# Patient Record
Sex: Female | Born: 1955
Health system: Southern US, Community
[De-identification: ages and names within clinical notes are randomized; demographics above are authoritative.]

## PROBLEM LIST (undated history)

## (undated) DIAGNOSIS — T7840XA Allergy, unspecified, initial encounter: Secondary | ICD-10-CM

## (undated) DIAGNOSIS — M069 Rheumatoid arthritis, unspecified: Secondary | ICD-10-CM

## (undated) HISTORY — DX: Rheumatoid arthritis, unspecified: M06.9

## (undated) HISTORY — PX: SYNOVECTOMY FOOT: SUR1321

## (undated) HISTORY — DX: Allergy, unspecified, initial encounter: T78.40XA

---

## 1977-06-03 HISTORY — PX: INNER EAR SURGERY: SHX679

## 1995-06-04 HISTORY — PX: ANKLE FUSION: SHX881

## 2008-05-13 ENCOUNTER — Other Ambulatory Visit: Admission: RE | Admit: 2008-05-13 | Discharge: 2008-05-13 | Payer: Self-pay | Admitting: Family Medicine

## 2009-12-25 ENCOUNTER — Other Ambulatory Visit: Admission: RE | Admit: 2009-12-25 | Discharge: 2009-12-25 | Payer: Self-pay | Admitting: Family Medicine

## 2011-11-13 ENCOUNTER — Other Ambulatory Visit: Payer: Self-pay

## 2011-11-13 DIAGNOSIS — I83893 Varicose veins of bilateral lower extremities with other complications: Secondary | ICD-10-CM

## 2011-12-13 ENCOUNTER — Encounter: Payer: Self-pay | Admitting: Vascular Surgery

## 2011-12-31 ENCOUNTER — Encounter: Payer: Self-pay | Admitting: Vascular Surgery

## 2012-01-01 ENCOUNTER — Encounter (INDEPENDENT_AMBULATORY_CARE_PROVIDER_SITE_OTHER): Payer: BC Managed Care – PPO | Admitting: *Deleted

## 2012-01-01 ENCOUNTER — Encounter: Payer: Self-pay | Admitting: Vascular Surgery

## 2012-01-01 ENCOUNTER — Ambulatory Visit (INDEPENDENT_AMBULATORY_CARE_PROVIDER_SITE_OTHER): Payer: BC Managed Care – PPO | Admitting: Vascular Surgery

## 2012-01-01 VITALS — BP 147/74 | HR 62 | Resp 18 | Ht 65.0 in | Wt 121.0 lb

## 2012-01-01 DIAGNOSIS — M79609 Pain in unspecified limb: Secondary | ICD-10-CM | POA: Insufficient documentation

## 2012-01-01 DIAGNOSIS — I83893 Varicose veins of bilateral lower extremities with other complications: Secondary | ICD-10-CM | POA: Insufficient documentation

## 2012-01-01 NOTE — Progress Notes (Signed)
[facility] and [facility]in [facility]ist [facility]  Patient name: Brittany Gutierrez MRN: [id number] DOB: [birth year] Sex: female  REASON FOR CONSULT: varicose veins of the right lower extremity  HPI: Brittany Gutierrez is a 56 y.o. female who is had varicose veins her right lower extremity for many years. These have been gradually worsening. She experiences aching pain and heaviness in her legs when she is standing or sitting for a prolonged period of time. 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 She is unaware of any history of DVT or phlebitis. She's had no previous bleeding episodes related to her varicose veins.  Past Medical History  Diagnosis Date  . RA (rheumatoid arthritis)     juvenile  . Allergy     seasonal allergies    Family History  Problem Relation Age of Onset  . Hyperlipidemia Mother   . Hypertension Mother   . Cancer Father     postate  . Hypertension Maternal Grandmother     SOCIAL HISTORY: History  Substance Use Topics  . Smoking status: Former Smoker -- 1.0 packs/day    Types: Cigarettes    Quit date: 06/03/1980  . Smokeless tobacco: Not on file  . Alcohol Use: No    Allergies  Allergen Reactions  . Penicillins Rash    Current Outpatient Prescriptions  Medication Sig Dispense Refill  . Calcium Carbonate-Vit D-Min 600-200 MG-UNIT TABS Take by mouth daily.      . Multiple Vitamin (MULTIVITAMIN) tablet Take 1 tablet by mouth daily.      . naproxen (NAPROSYN) 500 MG tablet Take 500 mg by mouth as needed.      . vitamin C (ASCORBIC ACID) 250 MG tablet Take 250 mg by mouth daily.        REVIEW OF SYSTEMS: Arly.Keller ] denotes positive finding; [  ] denotes negative finding  CARDIOVASCULAR:  [ ]  chest pain   [ ]  chest pressure   [ ]  palpitations   [ ]  orthopnea   [ ]  dyspnea on exertion   [ ]  claudication   [ ]  rest pain   [ ]  DVT   [ ]  phlebitis PULMONARY:   [ ]  productive cough   [ ]  asthma   [ ]  wheezing NEUROLOGIC:   [ ]  weakness  [ ]   paresthesias  [ ]  aphasia  [ ]  amaurosis  [ ]  dizziness HEMATOLOGIC:   [ ]  bleeding problems   [ ]  clotting disorders MUSCULOSKELETAL:  [ ]  joint pain   [ ]  joint swelling [ ]  leg swelling GASTROINTESTINAL: [ ]   blood in stool  [ ]   hematemesis GENITOURINARY:  [ ]   dysuria  [ ]   hematuria PSYCHIATRIC:  [ ]  history of major depression INTEGUMENTARY:  [ ]  rashes  [ ]  ulcers CONSTITUTIONAL:  [ ]  fever   [ ]  chills  PHYSICAL EXAM: Filed Vitals:   01/01/12 1133  BP: 147/74  Pulse: 62  Resp: 18  Height: 5\' 5"  (1.651 m)  Weight: 121 lb (54.885 kg)   Body mass index is 20.14 kg/(m^2). GENERAL: The patient is a well-nourished female, in no acute distress. The vital signs are documented above. CARDIOVASCULAR: There is a regular rate and rhythm without significant murmur appreciated. She has palpable pedal pulses bilaterally. I do not detect carotid bruits. She has no significant lower extremity swelling. PULMONARY: There is good air exchange bilaterally without wheezing or rales. ABDOMEN: Soft and non-tender with normal pitched bowel  sounds.  MUSCULOSKELETAL: There are no major deformities or cyanosis. NEUROLOGIC: No focal weakness or paresthesias are detected. SKIN: she has some spider veins and reticular veins of her right lower extremity the medial and posterior aspect of her right leg. PSYCHIATRIC: The patient has a normal affect.  I have independently interpreted her venous duplex study. This shows no evidence of DVT in the right lower extremity. She has no evidence of deep vein reflux on the right. Her greater saphenous vein is competent. She has only one short segment of the vein of Giacomini which is incompetent.  MEDICAL ISSUES:  Varicose veins of lower extremities with other complications Duplex scans not showing incompetence of the greater saphenous vein or deep veins in the right leg. I think she would be a reasonable candidate for sclerotherapy if she wished to have her varicose  veins addressed. I have given her a card with a vein center here to discuss this. I have reassured her that she had no evidence of valvular incompetence in her deep veins on the right or in the right greater saphenous vein. We've also discussed the importance of intermittent leg elevation and I have given her a prescription for knee-high compression stockings with a gradient of 20-30 mm of mercury. We'll be happy to see her back at any time if any new vascular issues arise.   Eugenie Harewood S [facility] [place] Beeper: [phone]

## 2012-01-01 NOTE — Assessment & Plan Note (Signed)
Duplex scans not showing incompetence of the greater saphenous vein or deep veins in the right leg. I think she would be a reasonable candidate for sclerotherapy if she wished to have her varicose veins addressed. I have given her a card with a vein center here to discuss this. I have reassured her that she had no evidence of valvular incompetence in her deep veins on the right or in the right greater saphenous vein. We've also discussed the importance of intermittent leg elevation and I have given her a prescription for knee-high compression stockings with a gradient of 20-30 mm of mercury. We'll be happy to see her back at any time if any new vascular issues arise.

## 2012-01-21 ENCOUNTER — Encounter: Payer: Self-pay | Admitting: *Deleted

## 2012-01-22 ENCOUNTER — Ambulatory Visit (INDEPENDENT_AMBULATORY_CARE_PROVIDER_SITE_OTHER): Payer: BC Managed Care – PPO | Admitting: *Deleted

## 2012-01-22 ENCOUNTER — Encounter: Payer: Self-pay | Admitting: *Deleted

## 2012-01-22 DIAGNOSIS — I781 Nevus, non-neoplastic: Secondary | ICD-10-CM

## 2012-01-22 NOTE — Progress Notes (Signed)
X=.3% Sotradecol administered with a 27g butterfly.  Patient received a total of 6cc.  Treated all areas of concern for this nice lady. Tol proced well. Will follow prn.  Photos: yes  Compression stockings applied: yes

## 2012-01-27 ENCOUNTER — Encounter: Payer: Self-pay | Admitting: Vascular Surgery

## 2012-02-10 ENCOUNTER — Ambulatory Visit (INDEPENDENT_AMBULATORY_CARE_PROVIDER_SITE_OTHER): Payer: BC Managed Care – PPO | Admitting: *Deleted

## 2012-02-10 DIAGNOSIS — I781 Nevus, non-neoplastic: Secondary | ICD-10-CM

## 2012-02-10 NOTE — Progress Notes (Signed)
Follow up on her recent sclero. She developed an area where the surface vein looks black like a scab and the surrounding area was red and is turning browner and patient says it is decreasing in size. I think she has had a reaction to the sotradecol and reassured her that this is a rare but possible reaction to the medicine. Put some silvadene on the area, covered with a telfa pad and asked her to call me next week.

## 2012-02-20 ENCOUNTER — Telehealth: Payer: Self-pay | Admitting: *Deleted

## 2012-02-20 NOTE — Telephone Encounter (Signed)
Returning Mrs. Swartzlander telephone message for Clementeen Hoof RN.  On earlier telephone message, Mrs. Stevick had stated that her leg looks much better.  She stated that her ankle was not swollen and that "the area where the vein was is pink ."  Mrs. Craun stated that she is currently using Neosporin ointment and a bandaide and was wondering if she should leave it open to air or use Neosporin and bandaide.  Spoke with Clementeen Hoof RN about Mrs. Lines concerns.  Left telephone voice message conveying that Neosporin and bandaide was not necessary since the area was not infected but was OK if Mrs. Bilello wanted to continue that regimen.  Rankin, Neena Rhymes

## 2014-11-14 ENCOUNTER — Ambulatory Visit
Admission: RE | Admit: 2014-11-14 | Discharge: 2014-11-14 | Disposition: A | Discharge status: Home / Self Care | Payer: 59 | Source: Ambulatory Visit | Attending: Family Medicine | Admitting: Family Medicine

## 2014-11-14 ENCOUNTER — Other Ambulatory Visit: Payer: Self-pay | Admitting: Family Medicine

## 2014-11-14 DIAGNOSIS — M542 Cervicalgia: Secondary | ICD-10-CM

## 2016-10-06 IMAGING — CR DG CERVICAL SPINE 2 OR 3 VIEWS
3 series · 3 of 3 positions shown · non-contrast
Comparison: August 09, 2010

CLINICAL DATA: One year history of cervicalgia. No history of
recent trauma.

EXAM:
CERVICAL SPINE - 2-3 VIEW

[view not recorded (1 of 3)]
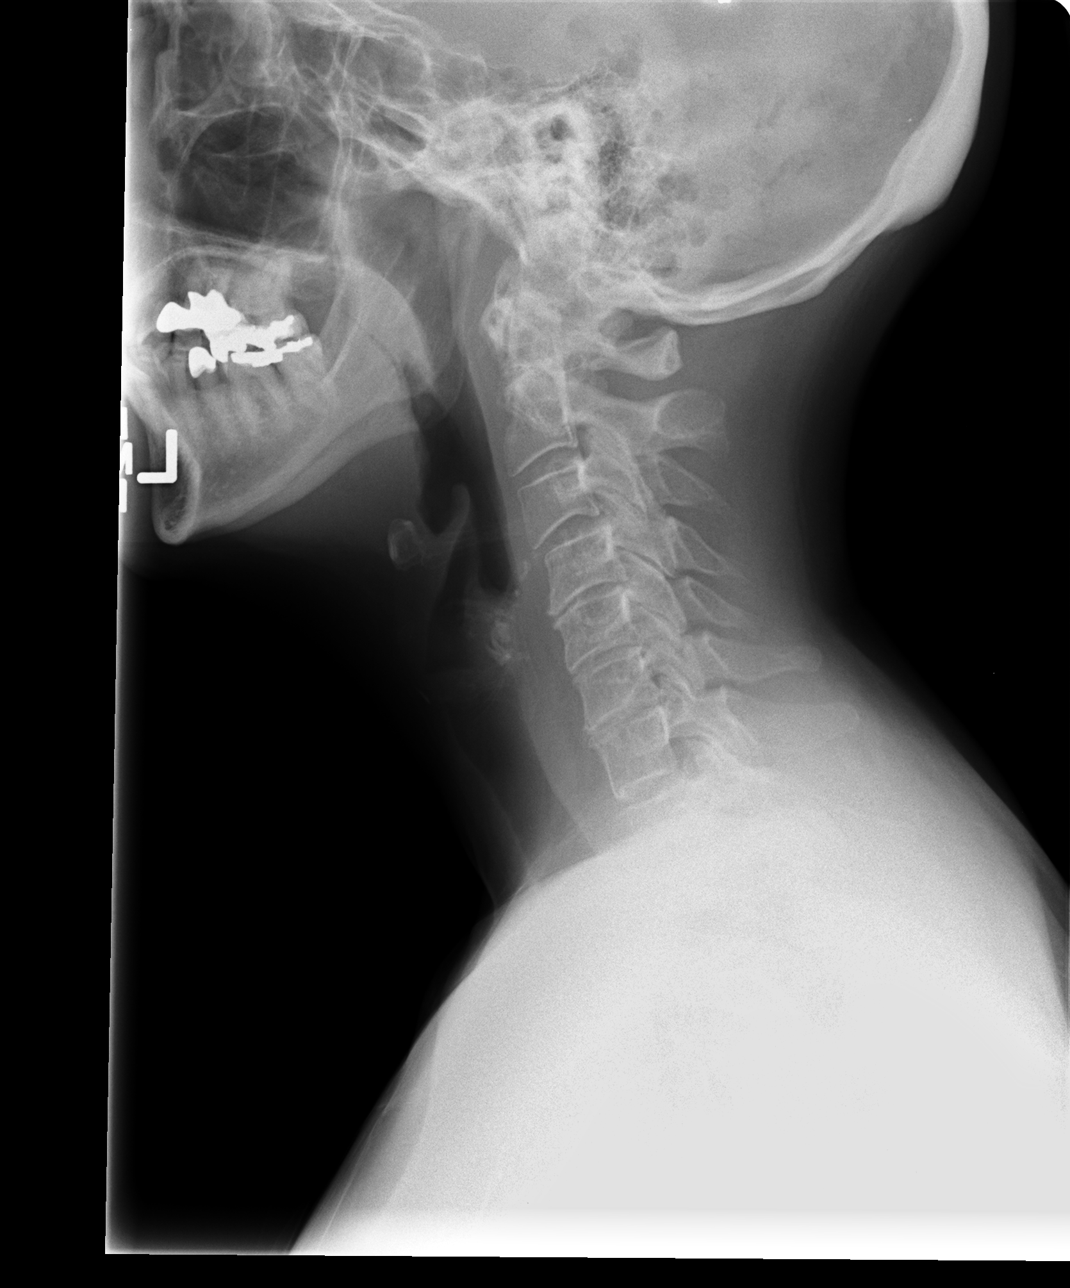

[view not recorded (2 of 3)]
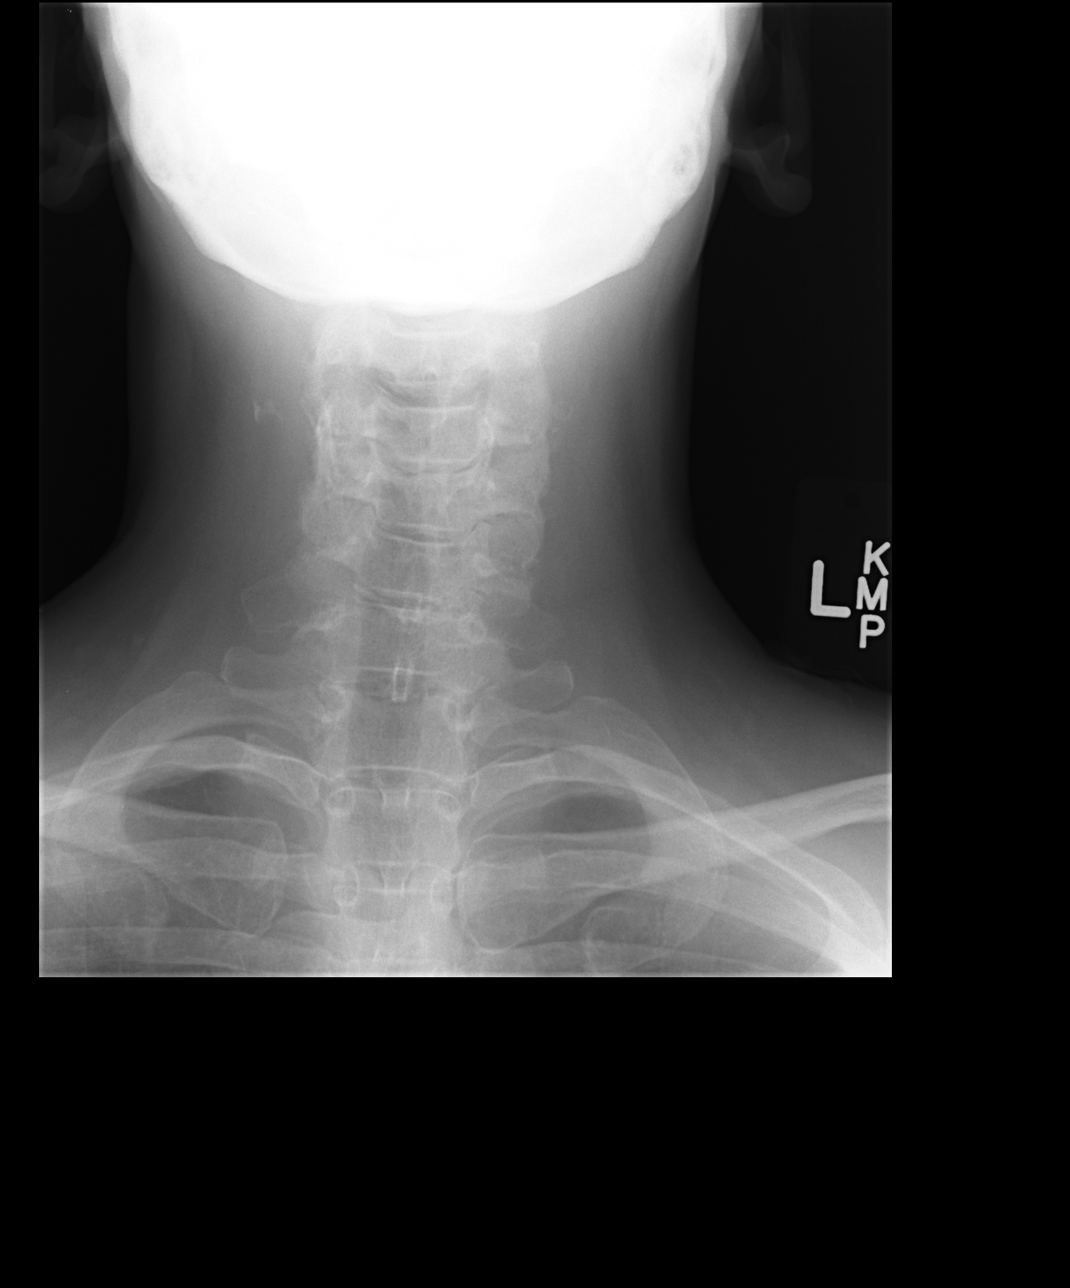

[view not recorded (3 of 3)]
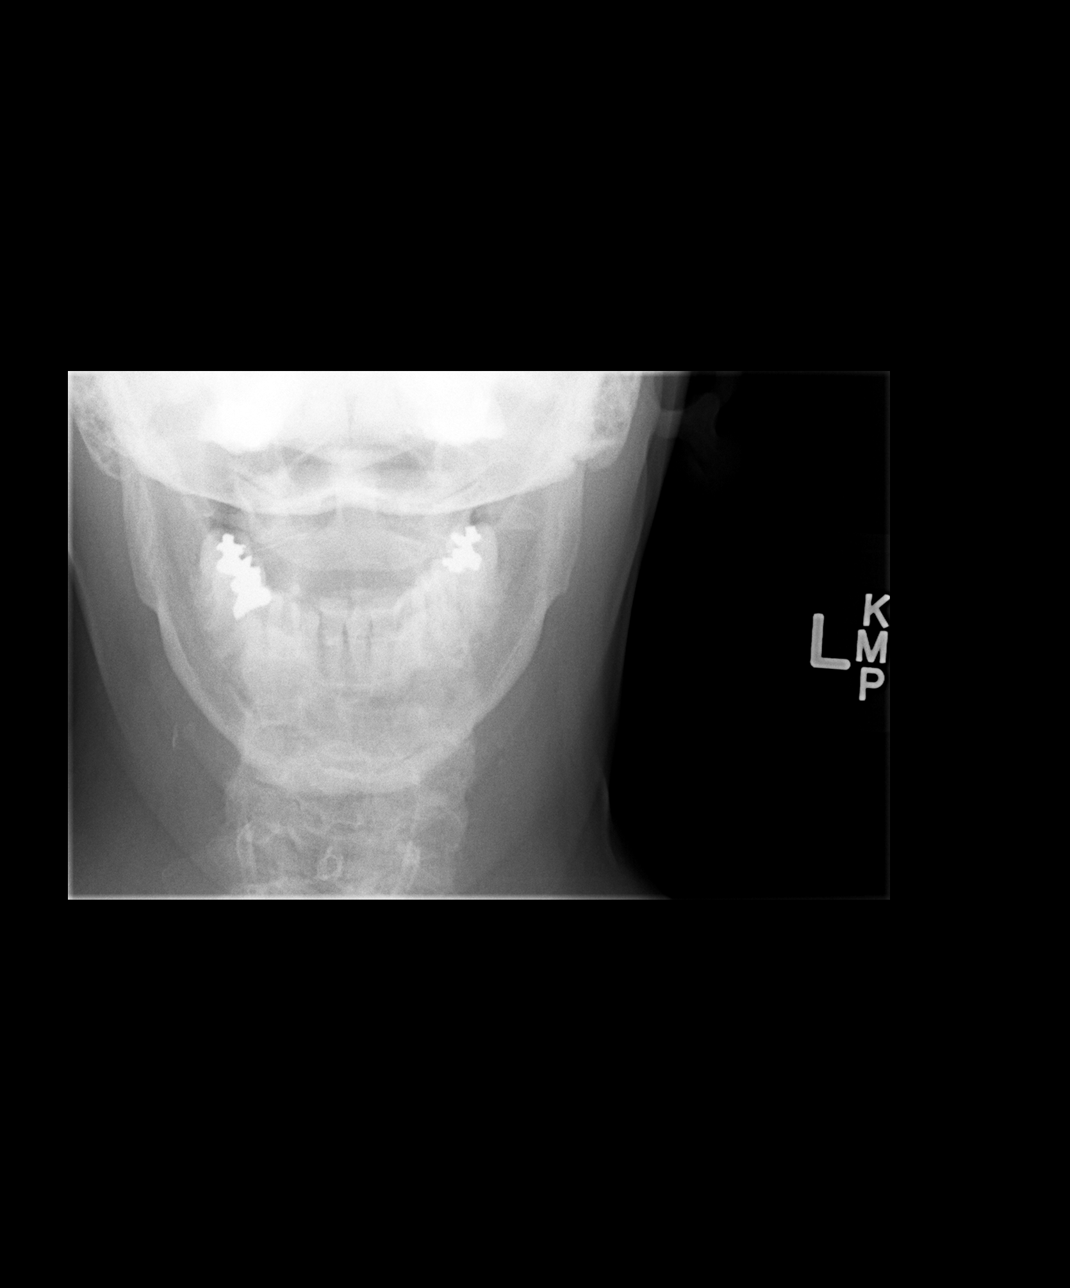

[3 of 3 positions shown; findings below may reference images not displayed]

FINDINGS: Frontal, lateral, and open-mouth odontoid images were obtained.
There is no fracture or spondylolisthesis. Prevertebral soft tissues
and predental space regions are normal. There is moderately severe
narrowing at C4-5, C5-6, and C6-7, stable. No erosive change. There
is calcification in the right carotid artery.
IMPRESSION: Stable multilevel osteoarthritic change. No fracture or
spondylolisthesis. There is right carotid artery calcification.

## 2018-01-01 ENCOUNTER — Ambulatory Visit (INDEPENDENT_AMBULATORY_CARE_PROVIDER_SITE_OTHER): Payer: Self-pay

## 2018-01-01 ENCOUNTER — Encounter (INDEPENDENT_AMBULATORY_CARE_PROVIDER_SITE_OTHER): Payer: Self-pay | Admitting: Orthopedic Surgery

## 2018-01-01 ENCOUNTER — Ambulatory Visit (INDEPENDENT_AMBULATORY_CARE_PROVIDER_SITE_OTHER): Payer: Self-pay | Admitting: Orthopedic Surgery

## 2018-01-01 VITALS — Ht 65.0 in | Wt 121.0 lb

## 2018-01-01 DIAGNOSIS — M25572 Pain in left ankle and joints of left foot: Secondary | ICD-10-CM

## 2018-01-04 ENCOUNTER — Encounter (INDEPENDENT_AMBULATORY_CARE_PROVIDER_SITE_OTHER): Payer: Self-pay | Admitting: Orthopedic Surgery

## 2018-01-04 NOTE — Progress Notes (Signed)
Office Visit Note   Patient: Brittany Gutierrez           Date of Birth: 1955/10/17           MRN: [id number] Visit Date: 01/01/2018              Requested by: Hulan Fess, MD [place], [place] [address] PCP: Hulan Fess, MD  Chief Complaint  Patient presents with  . Left Ankle - Pain    Hx of ankle fusion with Dr. Larose Kells       HPI: Patient is a 62 year old woman who is status post ankle and subtalar fusion with Dr. Larose Kells.  Patient complains that she has global pain and swelling in her foot with activities pain at night.  Patient states the symptoms were worse after she was doing an Olympic day at preschool.  Patient has tried Mobic and diclofenac.  Assessment & Plan: Visit Diagnoses:  1. Pain in left ankle and joints of left foot     Plan: Recommended a stiff soled Trail running shoe recommended over-the-counter orthotics for arch support.  Do not feel that any further surgical fusion would be appropriate.  Recommended CBD lotion to help with the inflammation.  Follow-Up Instructions: Return if symptoms worsen or fail to improve.   Ortho Exam  Patient is alert, oriented, no adenopathy, well-dressed, normal affect, normal respiratory effort. Examination patient has a good pulse her foot is plantigrade she is tender to palpation across the midfoot.  The ankle and subtalar joint are well fused with no pain with attempted range of motion.  Imaging: No results found. No images are attached to the encounter.  Labs: No results found for: HGBA1C, ESRSEDRATE, CRP, LABURIC, REPTSTATUS, GRAMSTAIN, CULT, LABORGA   No results found for: ALBUMIN, PREALBUMIN, LABURIC  Body mass index is 20.14 kg/m.  Orders:  Orders Placed This Encounter  Procedures  . XR Ankle Complete Left   No orders of the defined types were placed in this encounter.    Procedures: No procedures performed  Clinical Data: No additional findings.  ROS:  All other systems  negative, except as noted in the HPI. Review of Systems  Objective: Vital Signs: Ht 5\' 5"  (1.651 m)   Wt 121 lb (54.9 kg)   BMI 20.14 kg/m   Specialty Comments:  No specialty comments available.  PMFS History: Patient Active Problem List   Diagnosis Date Noted  . Pain in limb 01/01/2012  . Varicose veins of lower extremities with other complications 93/57/0177   Past Medical History:  Diagnosis Date  . Allergy    seasonal allergies  . RA (rheumatoid arthritis) ([facility])    juvenile    Family History  Problem Relation Age of Onset  . Hyperlipidemia Mother   . Hypertension Mother   . Cancer Father        postate  . Hypertension Maternal Grandmother     Past Surgical History:  Procedure Laterality Date  . [place]   left  . INNER EAR SURGERY  1979  . SYNOVECTOMY FOOT     left   Social History   Occupational History  . Not on file  Tobacco Use  . Smoking status: Former Smoker    Packs/day: 1.00    Types: Cigarettes    Last attempt to quit: 06/03/1980    Years since quitting: 37.6  Substance and Sexual Activity  . Alcohol use: No  . Drug use: No  . Sexual activity: Not  on file

## 2018-01-07 ENCOUNTER — Telehealth (INDEPENDENT_AMBULATORY_CARE_PROVIDER_SITE_OTHER): Payer: Self-pay | Admitting: Orthopedic Surgery

## 2018-01-07 NOTE — Telephone Encounter (Signed)
Patient left a voicemail needing a prescription for an orthotic faxed to [facility]. Patients # [phone]

## 2018-01-07 NOTE — Telephone Encounter (Signed)
Called pt and advised OTC orthotics per last dictation. Pt voiced understanding and copy of last dictation mailed to her per request.

## 2018-01-16 ENCOUNTER — Ambulatory Visit (INDEPENDENT_AMBULATORY_CARE_PROVIDER_SITE_OTHER): Payer: Self-pay | Admitting: Podiatry

## 2018-01-16 ENCOUNTER — Encounter

## 2018-01-16 ENCOUNTER — Encounter: Payer: Self-pay | Admitting: Podiatry

## 2018-01-16 DIAGNOSIS — M779 Enthesopathy, unspecified: Secondary | ICD-10-CM

## 2018-01-16 DIAGNOSIS — Z981 Arthrodesis status: Secondary | ICD-10-CM

## 2018-01-16 MED ORDER — CICLOPIROX 8 % EX SOLN: Freq: Every day | CUTANEOUS | 2 refills | Status: AC

## 2018-01-16 MED ORDER — DICLOFENAC SODIUM 1 % TD GEL: 2.0000 g | Freq: Four times a day (QID) | TRANSDERMAL | 2 refills | Status: AC

## 2018-01-16 NOTE — Progress Notes (Signed)
Subjective:    Patient ID: Brittany Gutierrez, female    DOB: 05/04/1956, 62 y.o.   MRN: [id number]  HPI 62 year old female presents the office today for concerns of left heel, ankle pain.  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  She did not have immediate onset of pain it was a little after the activity.  She did follow-up with orthopedics as well as her rheumatologist.  She did change shoes as well as wearing an insert which does help some but she continues to get pain to the left side.  She points to the side as well as the back of her ankle where she gets discomfort.   Review of Systems  All other systems reviewed and are negative.  Past Medical History:  Diagnosis Date  . Allergy    seasonal allergies  . RA (rheumatoid arthritis) ([place])    juvenile    Past Surgical History:  Procedure Laterality Date  . [place]   left  . INNER EAR SURGERY  1979  . SYNOVECTOMY FOOT     left     Current Outpatient Medications:  .  Calcium Carbonate-Vit D-Min 600-200 MG-UNIT TABS, Take by mouth daily., Disp: , Rfl:  .  ciclopirox (PENLAC) 8 % solution, Apply topically at bedtime. Apply over nail and surrounding skin. Apply daily over previous coat. After seven (7) days, may remove with alcohol and continue cycle., Disp: 6.6 mL, Rfl: 2 .  diclofenac sodium (VOLTAREN) 1 % GEL, Apply 2 g topically 4 (four) times daily. Rub into affected area of foot 2 to 4 times daily, Disp: 100 g, Rfl: 2 .  Multiple Vitamin (MULTIVITAMIN) tablet, Take 1 tablet by mouth daily., Disp: , Rfl:  .  naproxen (NAPROSYN) 500 MG tablet, Take 500 mg by mouth as needed., Disp: , Rfl:  .  vitamin C (ASCORBIC ACID) 250 MG tablet, Take 250 mg by mouth daily., Disp: , Rfl:   Allergies  Allergen Reactions  .  Penicillins Rash        Objective:   Physical Exam  General: AAO x3, NAD  Dermatological: Skin is warm, dry and supple bilateral. Nails x 10 are well manicured; remaining integument appears unremarkable at this time. There are no open sores, no preulcerative lesions, no rash or signs of infection present.  Vascular: Dorsalis Pedis artery and Posterior Tibial artery pedal pulses are 2/4 bilateral with immedate capillary fill time.  There is no pain with calf compression, swelling, warmth, erythema.   Neruologic: Grossly intact via light touch bilateral. Protective threshold with Semmes Wienstein monofilament intact to all pedal sites bilateral.   Musculoskeletal: Ankle is fused.  There is tenderness mostly on the peroneal tendons to the lateral aspect of the ankle and posterior to the lateral malleolus.  Minimal discomfort of the Achilles tendon but overall the peroneal tendon, Achilles tendon appear to be intact there is no deficit noted and there is no significant edema, erythema.  Mild tenderness palpation on the plantar aspect the left heel on the insertion of plantar fascia but again the plantar fascia appears to be intact.  There is no pain with lateral compression of calcaneus there is no area pinpoint tenderness.  Muscular strength 5/5 in all groups tested bilateral  Assessment & Plan:  62 year old female with tendinitis left foot -Treatment options discussed including all alternatives, risks, and complications -Etiology of symptoms were discussed -I reviewed the x-rays that she brought in from previous.  Unable to appreciate any evidence of acute fracture.  Status post fusion of the ankle. -Although she has gotten some relief with issues in the answer she still having discomfort.  We discussed multiple treatment options for this point I recommend immobilization in a cam boot to rest the area over the next 2 weeks.  As she starts to feel better we can start to transition back into  the shoe with the inserts and discussed stretching, icing exercises daily.  Anti-inflammatories as needed.  Trula Slade DPM

## 2018-02-12 ENCOUNTER — Ambulatory Visit (INDEPENDENT_AMBULATORY_CARE_PROVIDER_SITE_OTHER): Payer: Self-pay | Admitting: Podiatry

## 2018-02-12 ENCOUNTER — Encounter: Payer: Self-pay | Admitting: Podiatry

## 2018-02-12 DIAGNOSIS — S99912S Unspecified injury of left ankle, sequela: Secondary | ICD-10-CM

## 2018-02-12 DIAGNOSIS — M779 Enthesopathy, unspecified: Secondary | ICD-10-CM

## 2018-02-12 MED ORDER — TRIAMCINOLONE ACETONIDE 10 MG/ML IJ SUSP: 10.0000 mg | Freq: Once | INTRAMUSCULAR | Status: AC

## 2018-02-13 ENCOUNTER — Ambulatory Visit: Payer: Self-pay | Admitting: Podiatry

## 2018-02-13 NOTE — Progress Notes (Signed)
Subjective: 62 year old female presents the office for follow-up evaluation of left ankle, foot pain.  He states that she has been on her feet quite a bit doing activity at home getting ready for a family reunion she has not been able to rest her foot.  Because this is not been able to wear the walking boot.  She states that overall her pain is the same as what it has been previously.  Pain is worse after the end of the day after being on her feet.  Denies any systemic complaints such as fevers, chills, nausea, vomiting. No acute changes since last appointment, and no other complaints at this time.   Objective: AAO x3, NAD DP/PT pulses palpable bilaterally, CRT less than 3 seconds There is tenderness mostly to the lateral aspect the foot and ankle this is on the sinus tarsi as well as the peroneal tendon as well as the distal border of the fibula.  There is no area pinpoint tenderness.  Minimal edema there is no erythema or warmth. No open lesions or pre-ulcerative lesions.  No pain with calf compression, swelling, warmth, erythema  Assessment: Tendinitis left foot  Plan: -All treatment options discussed with the patient including all alternatives, risks, complications.  -At this time we discussed a steroid injection.  Understanding risks and complications she wishes to proceed.  The majority tenderness is along the lateral aspect foot on the sinus tarsi area however she had a previous fusion it appears.  I did inject the area of the area skin with alcohol with a mixture of 1 cc Kenalog 10, 0.5 cc of Marcaine plain 0.5 cc of 2% lidocaine plain.  Postinjection care was discussed.  I dispensed a Tri-Lock ankle brace as well as a compression anklet. -Wishes to hold off on MRI -Patient encouraged to call the office with any questions, concerns, change in symptoms.   Trula Slade DPM

## 2018-03-05 ENCOUNTER — Encounter: Payer: Self-pay | Admitting: Podiatry

## 2018-03-05 ENCOUNTER — Ambulatory Visit (INDEPENDENT_AMBULATORY_CARE_PROVIDER_SITE_OTHER): Payer: Self-pay | Admitting: Podiatry

## 2018-03-05 DIAGNOSIS — M722 Plantar fascial fibromatosis: Secondary | ICD-10-CM

## 2018-03-05 MED ORDER — TRIAMCINOLONE ACETONIDE 10 MG/ML IJ SUSP
10.0000 mg | Freq: Once | INTRAMUSCULAR | Status: AC
  Administered 2018-03-05: 10 mg

## 2018-03-08 NOTE — Progress Notes (Signed)
Subjective: 62 year old female presents the office today for follow-up evaluation of pain to her left foot.  She says overall she is doing better she is about 70% improved.  Injection did help quite a bit.  She points more on the medial heel she is majority of tenderness now.  She denies any recent injury or trauma since I last saw her. Denies any systemic complaints such as fevers, chills, nausea, vomiting. No acute changes since last appointment, and no other complaints at this time.   Objective: AAO x3, NAD DP/PT pulses palpable bilaterally, CRT less than 3 seconds There is decreased tenderness to palpation along the lateral aspect foot warm sinus tarsi area.  Today there is tenderness palpation along the plantar medial tubercle of the calcaneus at the initiation of the plantar fascia.  Plantar fascia appears to be intact.  Achilles tendon intact.  She gets some discomfort more on the navicular tuberosity as well however around the posterior tibial tendon appears to be intact.  There is no overlying edema, erythema, increase in warmth. No open lesions or pre-ulcerative lesions.  No pain with calf compression, swelling, warmth, erythema  Assessment: Plantar fasciitis  Plan: -All treatment options discussed with the patient including all alternatives, risks, complications.  -Today we discussed treatment options and she wishes to proceed with a steroid injection to the right heel is especially with majority of tenderness.  See procedure note below.  Continue with ankle brace and compression for now.  Gradual increase in activity level as she feels able to. -Patient encouraged to call the office with any questions, concerns, change in symptoms.   Procedure: Injection Tendon/Ligament Discussed alternatives, risks, complications and verbal consent was obtained.  Location: Left plantar fascia at the glabrous junction; medial approach. Skin Prep: Alcohol. Injectate: 0.5cc 0.5% marcaine plain, 0.5 cc  2% lidocaine plain and, 1 cc kenalog 10. Disposition: Patient tolerated procedure well. Injection site dressed with a band-aid.  Post-injection care was discussed and return precautions discussed.   Trula Slade DPM

## 2018-04-02 ENCOUNTER — Encounter: Payer: Self-pay | Admitting: Podiatry

## 2018-04-02 ENCOUNTER — Ambulatory Visit (INDEPENDENT_AMBULATORY_CARE_PROVIDER_SITE_OTHER): Payer: Self-pay | Admitting: Podiatry

## 2018-04-02 DIAGNOSIS — M722 Plantar fascial fibromatosis: Secondary | ICD-10-CM

## 2018-04-02 DIAGNOSIS — M779 Enthesopathy, unspecified: Secondary | ICD-10-CM

## 2018-04-06 NOTE — Progress Notes (Signed)
Subjective: 62 year old female presents the office today for follow-up evaluation of left foot pain.  She states that the injection did help as I last saw her she overdid it she was on her feet a lot more and she feels that the pain started to come back.  She has some occasional pain in the outside aspect of her foot and ankle but that is much improved.  No recent injury or trauma that she can recall she has no other concerns. Denies any systemic complaints such as fevers, chills, nausea, vomiting. No acute changes since last appointment, and no other complaints at this time.   Objective: AAO x3, NAD DP/PT pulses palpable bilaterally, CRT less than 3 seconds Tenderness palpation on plantar medial tubercle of the calcaneus and insertion of plantar fasciitis on the left side.  Plantar fascia appears to be intact.  Achilles tendon appears to be intact.  No edema identified at this time but she occasionally gets some swelling.  No erythema or warmth. No open lesions or pre-ulcerative lesions.  No pain with calf compression, swelling, warmth, erythema  Assessment: Left foot plantar fasciitis  Plan: -All treatment options discussed with the patient including all alternatives, risks, complications.  -Discussed repeat steroid injection but will hold off today.  I will defer to start with some stretching, icing exercises daily.  I dispensed some power step inserts.  Also I dispensed an anterior splint to help dorsiflex the hallux to help with stretching the plantar fascia. -Patient encouraged to call the office with any questions, concerns, change in symptoms.   Trula Slade DPM

## 2018-05-18 ENCOUNTER — Ambulatory Visit (INDEPENDENT_AMBULATORY_CARE_PROVIDER_SITE_OTHER): Payer: Self-pay | Admitting: Podiatry

## 2018-05-18 ENCOUNTER — Encounter: Payer: Self-pay | Admitting: Podiatry

## 2018-05-18 DIAGNOSIS — M722 Plantar fascial fibromatosis: Secondary | ICD-10-CM

## 2018-05-18 DIAGNOSIS — M779 Enthesopathy, unspecified: Secondary | ICD-10-CM

## 2018-05-20 NOTE — Progress Notes (Signed)
Subjective: 62 year old female presents the office today for follow-up evaluation of left foot pain.  She said overall she is doing better.  She was doing some research online to find exercises for her to work on.  Asked to do some of the exercises she has discomfort the next day so she stopped.  She feels it is getting better but she does not want to reinjure and she was discussed further options at this point. Denies any systemic complaints such as fevers, chills, nausea, vomiting. No acute changes since last appointment, and no other complaints at this time.   Objective: AAO x3, NAD DP/PT pulses palpable bilaterally, CRT less than 3 seconds There is minimal tenderness palpation of the plantar medial tubercle of the calcaneus at the insertion of plantar fashion left side.  Overall the plantar fascial and Achilles tendon appear to be intact.  She is given some tenderness along the course of the flexor tendons as well as the peroneal tendon but overall the flexor, extensor, peroneal tendons appear to be intact.  No edema, erythema.  He was able joint fusion. No open lesions or pre-ulcerative lesions.  No pain with calf compression, swelling, warmth, erythema  Assessment: Left foot plantar fasciitis; tendinitis  Plan: -All treatment options discussed with the patient including all alternatives, risks, complications.  -Overall she is getting better but she was try to do exercises which is causing discomfort.  We discussed physical therapy.  I have talked to the physical therapist, [name] about self-pay price the patient.  She is intact her husband at this.  [facility]cription for [facility] was written today.  I want her to continue the brace for now but long-term I want her not to use the brace as she starts to strengthen her ankle tendons and help with the pain.  Trula Slade DPM

## 2018-06-22 ENCOUNTER — Telehealth: Payer: Self-pay | Admitting: Podiatry

## 2018-06-22 DIAGNOSIS — M722 Plantar fascial fibromatosis: Secondary | ICD-10-CM

## 2018-06-22 DIAGNOSIS — M779 Enthesopathy, unspecified: Secondary | ICD-10-CM

## 2018-06-22 NOTE — Telephone Encounter (Signed)
I am suppose to set up a appt. With Physical Therapy but I have not been notified, by anyone. Im just trying to find out the status of the referral.

## 2018-06-22 NOTE — Addendum Note (Signed)
Addended by: Harriett Sine D on: 06/22/2018 11:55 AM   Modules accepted: Orders

## 2018-06-22 NOTE — Telephone Encounter (Signed)
If she is doing OK lets do the PT first then let me see her. She is always welcome to come in but if she is doing OK then we can see her after. Thanks.

## 2018-06-22 NOTE — Telephone Encounter (Signed)
Pt has not completed physical therapy and she wanted to know if she should keep her appointment for Monday 06/29/2018. Her first physical therapy appointment is 06/30/2018.

## 2018-06-22 NOTE — Telephone Encounter (Signed)
Left message informing pt I was the referral/orders nurse and had not received the PT orders, but had reviewed Dr. Leigh Aurora LOV clinicals and saw he had wanted to refer her to [facility] PT, and I would hand carry the order to [facility] and she could call to schedule.

## 2018-06-23 NOTE — Telephone Encounter (Signed)
I called pt and informed of Dr. Leigh Aurora recommendation. Pt states she is doing okay and I told pt we routinely had pt's perform 4 weeks of PT and then follow-up, but if she should have a change during therapy then she could make an earlier appt.

## 2018-06-29 ENCOUNTER — Ambulatory Visit: Payer: Self-pay | Admitting: Podiatry

## 2018-08-05 DIAGNOSIS — L821 Other seborrheic keratosis: Secondary | ICD-10-CM | POA: Diagnosis not present

## 2018-08-05 DIAGNOSIS — D225 Melanocytic nevi of trunk: Secondary | ICD-10-CM | POA: Diagnosis not present

## 2018-08-05 DIAGNOSIS — L700 Acne vulgaris: Secondary | ICD-10-CM | POA: Diagnosis not present

## 2018-08-05 DIAGNOSIS — B351 Tinea unguium: Secondary | ICD-10-CM | POA: Diagnosis not present

## 2018-08-05 DIAGNOSIS — L2089 Other atopic dermatitis: Secondary | ICD-10-CM | POA: Diagnosis not present

## 2018-08-05 DIAGNOSIS — L308 Other specified dermatitis: Secondary | ICD-10-CM | POA: Diagnosis not present

## 2018-08-05 DIAGNOSIS — D1801 Hemangioma of skin and subcutaneous tissue: Secondary | ICD-10-CM | POA: Diagnosis not present

## 2018-08-05 DIAGNOSIS — Z85828 Personal history of other malignant neoplasm of skin: Secondary | ICD-10-CM | POA: Diagnosis not present

## 2018-08-05 DIAGNOSIS — L814 Other melanin hyperpigmentation: Secondary | ICD-10-CM | POA: Diagnosis not present

## 2018-08-26 DIAGNOSIS — Z23 Encounter for immunization: Secondary | ICD-10-CM | POA: Diagnosis not present

## 2018-08-26 DIAGNOSIS — Z1159 Encounter for screening for other viral diseases: Secondary | ICD-10-CM | POA: Diagnosis not present

## 2018-08-26 DIAGNOSIS — Z0000 Encounter for general adult medical examination without abnormal findings: Secondary | ICD-10-CM | POA: Diagnosis not present

## 2018-08-26 DIAGNOSIS — Z13220 Encounter for screening for lipoid disorders: Secondary | ICD-10-CM | POA: Diagnosis not present

## 2018-08-26 DIAGNOSIS — M8580 Other specified disorders of bone density and structure, unspecified site: Secondary | ICD-10-CM | POA: Diagnosis not present

## 2018-08-26 DIAGNOSIS — E785 Hyperlipidemia, unspecified: Secondary | ICD-10-CM | POA: Diagnosis not present

## 2018-08-26 DIAGNOSIS — M2550 Pain in unspecified joint: Secondary | ICD-10-CM | POA: Diagnosis not present

## 2018-08-26 DIAGNOSIS — E559 Vitamin D deficiency, unspecified: Secondary | ICD-10-CM | POA: Diagnosis not present

## 2018-08-26 DIAGNOSIS — Z1211 Encounter for screening for malignant neoplasm of colon: Secondary | ICD-10-CM | POA: Diagnosis not present

## 2018-08-26 DIAGNOSIS — Z1589 Genetic susceptibility to other disease: Secondary | ICD-10-CM | POA: Diagnosis not present

## 2018-08-27 DIAGNOSIS — E785 Hyperlipidemia, unspecified: Secondary | ICD-10-CM | POA: Diagnosis not present

## 2018-08-27 DIAGNOSIS — Z13220 Encounter for screening for lipoid disorders: Secondary | ICD-10-CM | POA: Diagnosis not present

## 2018-08-27 DIAGNOSIS — Z1159 Encounter for screening for other viral diseases: Secondary | ICD-10-CM | POA: Diagnosis not present

## 2018-08-27 DIAGNOSIS — Z1211 Encounter for screening for malignant neoplasm of colon: Secondary | ICD-10-CM | POA: Diagnosis not present

## 2018-08-27 DIAGNOSIS — E559 Vitamin D deficiency, unspecified: Secondary | ICD-10-CM | POA: Diagnosis not present

## 2018-08-27 DIAGNOSIS — M8580 Other specified disorders of bone density and structure, unspecified site: Secondary | ICD-10-CM | POA: Diagnosis not present

## 2018-08-27 DIAGNOSIS — M2550 Pain in unspecified joint: Secondary | ICD-10-CM | POA: Diagnosis not present

## 2018-08-27 DIAGNOSIS — Z1589 Genetic susceptibility to other disease: Secondary | ICD-10-CM | POA: Diagnosis not present

## 2018-08-27 DIAGNOSIS — Z0000 Encounter for general adult medical examination without abnormal findings: Secondary | ICD-10-CM | POA: Diagnosis not present

## 2018-11-16 DIAGNOSIS — Z1589 Genetic susceptibility to other disease: Secondary | ICD-10-CM | POA: Diagnosis not present

## 2018-11-16 DIAGNOSIS — M2550 Pain in unspecified joint: Secondary | ICD-10-CM | POA: Diagnosis not present

## 2018-11-16 DIAGNOSIS — M0890 Juvenile arthritis, unspecified, unspecified site: Secondary | ICD-10-CM | POA: Diagnosis not present

## 2018-11-16 DIAGNOSIS — M150 Primary generalized (osteo)arthritis: Secondary | ICD-10-CM | POA: Diagnosis not present

## 2018-11-16 DIAGNOSIS — Z79899 Other long term (current) drug therapy: Secondary | ICD-10-CM | POA: Diagnosis not present

## 2018-11-26 ENCOUNTER — Other Ambulatory Visit: Payer: Self-pay

## 2018-11-26 ENCOUNTER — Ambulatory Visit (INDEPENDENT_AMBULATORY_CARE_PROVIDER_SITE_OTHER): Payer: Medicare Other | Admitting: Podiatry

## 2018-11-26 DIAGNOSIS — B351 Tinea unguium: Secondary | ICD-10-CM

## 2018-11-26 DIAGNOSIS — M722 Plantar fascial fibromatosis: Secondary | ICD-10-CM | POA: Diagnosis not present

## 2018-11-26 MED ORDER — DICLOFENAC SODIUM 1 % TD GEL
2.0000 g | Freq: Four times a day (QID) | TRANSDERMAL | 2 refills | Status: AC
Start: 2018-11-26 — End: ?

## 2018-11-26 MED ORDER — EFINACONAZOLE 10 % EX SOLN: 1.0000 [drp] | Freq: Every day | CUTANEOUS | 11 refills | Status: DC

## 2018-11-30 ENCOUNTER — Encounter: Payer: Self-pay | Admitting: Podiatry

## 2018-12-03 NOTE — Progress Notes (Signed)
Subjective: 63 year old female presents the office today for evaluation of left foot pain.  She says overall she is been doing better.  She was doing physical therapy but she had to stop.  She is been doing some exercises at home and when she does work her thighs and hip she starts to get pain into her ankle she points on the medial aspect of the ankle on the flexor tendons as well the arch of the foot.  No recent injury or trauma.  She is wearing regular shoes.  No swelling or redness. Denies any systemic complaints such as fevers, chills, nausea, vomiting. No acute changes since last appointment, and no other complaints at this time.   Objective: AAO x3, NAD DP/PT pulses palpable bilaterally, CRT less than 3 seconds Prior ankle fusion is present.  The majority tenderness along medial band plantar fascial axis but the plantar fascial peers to be intact.  Achilles tendon appears to be intact.  Subjectively she is getting some discomfort in the course the posterior tibial tendon.  No significant pain today and there is no edema, erythema. The nails are also discolored with yellow-brown discoloration.  There is no pain there is no redness or drainage. No open lesions or pre-ulcerative lesions.  No pain with calf compression, swelling, warmth, erythema  Assessment: Plantar fasciitis, tendonitis  Plan: -All treatment options discussed with the patient including all alternatives, risks, complications.  -Overall she is continue to improve.  She wants know she should continue with exercises.  We discussed continued home exercise will also return to physical therapy which she wants to do.  Prescription was written today.  We discussed changing shoes and good arch supports.  Symptoms continue we will get an MRI now that she has insurance and she is willing to do so if needed but her pain is minimal right continue with current treatment for now. -Voltaren gel as needed -Prescribe Jublia -Patient encouraged  to call the office with any questions, concerns, change in symptoms.   Trula Slade DPM

## 2018-12-17 DIAGNOSIS — M25572 Pain in left ankle and joints of left foot: Secondary | ICD-10-CM | POA: Diagnosis not present

## 2018-12-17 DIAGNOSIS — R269 Unspecified abnormalities of gait and mobility: Secondary | ICD-10-CM | POA: Diagnosis not present

## 2018-12-17 DIAGNOSIS — M25675 Stiffness of left foot, not elsewhere classified: Secondary | ICD-10-CM | POA: Diagnosis not present

## 2018-12-17 DIAGNOSIS — M25672 Stiffness of left ankle, not elsewhere classified: Secondary | ICD-10-CM | POA: Diagnosis not present

## 2018-12-17 DIAGNOSIS — M62562 Muscle wasting and atrophy, not elsewhere classified, left lower leg: Secondary | ICD-10-CM | POA: Diagnosis not present

## 2018-12-17 DIAGNOSIS — M059 Rheumatoid arthritis with rheumatoid factor, unspecified: Secondary | ICD-10-CM | POA: Diagnosis not present

## 2018-12-17 DIAGNOSIS — M62572 Muscle wasting and atrophy, not elsewhere classified, left ankle and foot: Secondary | ICD-10-CM | POA: Diagnosis not present

## 2018-12-23 DIAGNOSIS — M25675 Stiffness of left foot, not elsewhere classified: Secondary | ICD-10-CM | POA: Diagnosis not present

## 2018-12-23 DIAGNOSIS — R269 Unspecified abnormalities of gait and mobility: Secondary | ICD-10-CM | POA: Diagnosis not present

## 2018-12-23 DIAGNOSIS — M059 Rheumatoid arthritis with rheumatoid factor, unspecified: Secondary | ICD-10-CM | POA: Diagnosis not present

## 2018-12-23 DIAGNOSIS — M62562 Muscle wasting and atrophy, not elsewhere classified, left lower leg: Secondary | ICD-10-CM | POA: Diagnosis not present

## 2018-12-23 DIAGNOSIS — M25572 Pain in left ankle and joints of left foot: Secondary | ICD-10-CM | POA: Diagnosis not present

## 2018-12-23 DIAGNOSIS — M25672 Stiffness of left ankle, not elsewhere classified: Secondary | ICD-10-CM | POA: Diagnosis not present

## 2018-12-23 DIAGNOSIS — M62572 Muscle wasting and atrophy, not elsewhere classified, left ankle and foot: Secondary | ICD-10-CM | POA: Diagnosis not present

## 2018-12-29 DIAGNOSIS — M25672 Stiffness of left ankle, not elsewhere classified: Secondary | ICD-10-CM | POA: Diagnosis not present

## 2018-12-29 DIAGNOSIS — R269 Unspecified abnormalities of gait and mobility: Secondary | ICD-10-CM | POA: Diagnosis not present

## 2018-12-29 DIAGNOSIS — M62572 Muscle wasting and atrophy, not elsewhere classified, left ankle and foot: Secondary | ICD-10-CM | POA: Diagnosis not present

## 2018-12-29 DIAGNOSIS — M25675 Stiffness of left foot, not elsewhere classified: Secondary | ICD-10-CM | POA: Diagnosis not present

## 2018-12-29 DIAGNOSIS — M059 Rheumatoid arthritis with rheumatoid factor, unspecified: Secondary | ICD-10-CM | POA: Diagnosis not present

## 2018-12-29 DIAGNOSIS — M62562 Muscle wasting and atrophy, not elsewhere classified, left lower leg: Secondary | ICD-10-CM | POA: Diagnosis not present

## 2018-12-29 DIAGNOSIS — M25572 Pain in left ankle and joints of left foot: Secondary | ICD-10-CM | POA: Diagnosis not present

## 2019-01-06 DIAGNOSIS — M62572 Muscle wasting and atrophy, not elsewhere classified, left ankle and foot: Secondary | ICD-10-CM | POA: Diagnosis not present

## 2019-01-06 DIAGNOSIS — M25672 Stiffness of left ankle, not elsewhere classified: Secondary | ICD-10-CM | POA: Diagnosis not present

## 2019-01-06 DIAGNOSIS — M25572 Pain in left ankle and joints of left foot: Secondary | ICD-10-CM | POA: Diagnosis not present

## 2019-01-06 DIAGNOSIS — M059 Rheumatoid arthritis with rheumatoid factor, unspecified: Secondary | ICD-10-CM | POA: Diagnosis not present

## 2019-01-06 DIAGNOSIS — R269 Unspecified abnormalities of gait and mobility: Secondary | ICD-10-CM | POA: Diagnosis not present

## 2019-01-06 DIAGNOSIS — M62562 Muscle wasting and atrophy, not elsewhere classified, left lower leg: Secondary | ICD-10-CM | POA: Diagnosis not present

## 2019-01-06 DIAGNOSIS — M25675 Stiffness of left foot, not elsewhere classified: Secondary | ICD-10-CM | POA: Diagnosis not present

## 2019-01-07 ENCOUNTER — Encounter: Payer: Self-pay | Admitting: Podiatry

## 2019-01-07 ENCOUNTER — Ambulatory Visit (INDEPENDENT_AMBULATORY_CARE_PROVIDER_SITE_OTHER): Payer: Medicare Other | Admitting: Podiatry

## 2019-01-07 ENCOUNTER — Other Ambulatory Visit: Payer: Self-pay

## 2019-01-07 VITALS — Temp 97.1°F

## 2019-01-07 DIAGNOSIS — M722 Plantar fascial fibromatosis: Secondary | ICD-10-CM

## 2019-01-13 DIAGNOSIS — M62562 Muscle wasting and atrophy, not elsewhere classified, left lower leg: Secondary | ICD-10-CM | POA: Diagnosis not present

## 2019-01-13 DIAGNOSIS — M25672 Stiffness of left ankle, not elsewhere classified: Secondary | ICD-10-CM | POA: Diagnosis not present

## 2019-01-13 DIAGNOSIS — M62572 Muscle wasting and atrophy, not elsewhere classified, left ankle and foot: Secondary | ICD-10-CM | POA: Diagnosis not present

## 2019-01-13 DIAGNOSIS — M059 Rheumatoid arthritis with rheumatoid factor, unspecified: Secondary | ICD-10-CM | POA: Diagnosis not present

## 2019-01-13 DIAGNOSIS — M25572 Pain in left ankle and joints of left foot: Secondary | ICD-10-CM | POA: Diagnosis not present

## 2019-01-13 DIAGNOSIS — M25675 Stiffness of left foot, not elsewhere classified: Secondary | ICD-10-CM | POA: Diagnosis not present

## 2019-01-13 DIAGNOSIS — R269 Unspecified abnormalities of gait and mobility: Secondary | ICD-10-CM | POA: Diagnosis not present

## 2019-01-18 DIAGNOSIS — M722 Plantar fascial fibromatosis: Secondary | ICD-10-CM | POA: Insufficient documentation

## 2019-01-18 NOTE — Progress Notes (Signed)
Subjective: Sick female presents the office today for follow-up evaluation of left foot and ankle pain.  Overall she states that she is doing better but she still gets pain she points on the arch of the foot and more in the bottom of the heel.  The physical therapy has been helpful.  No recent injury or falls otherwise.  She does state that she was visiting her son walking up a hill which aggravated her foot.  Still on Jublia and tolerating well but she is had some mild improvement.  Denies any systemic complaints such as fevers, chills, nausea, vomiting. No acute changes since last appointment, and no other complaints at this time.   Objective: AAO x3, NAD DP/PT pulses palpable bilaterally, CRT less than 3 seconds Majority of tenderness along the plantar medial tubercle of the calcaneus at insertion of plantar fascia but also on the medial band of the arch of the foot.  No pain with lateral compression of calcaneus there is no edema, erythema.  Negative Tinel sign. No open lesions or pre-ulcerative lesions.  No pain with calf compression, swelling, warmth, erythema  Assessment: Plantar fasciitis; osteoporosis  Plan: -All treatment options discussed with the patient including all alternatives, risks, complications.  -Steroid injection performed.  See procedure note below.  Plan fascial taping applied.  Continue stretching, icing as well as physical therapy.  Discussed shoe modifications as well. -Continue Jublia for her nails. -Patient encouraged to call the office with any questions, concerns, change in symptoms.    Procedure: Injection Tendon/Ligament Discussed alternatives, risks, complications and verbal consent was obtained.  Location: Right plantar fascia at the glabrous junction; medial approach. Skin Prep: Alcohol  Injectate: 0.5cc 0.5% marcaine plain, 0.5 cc 2% lidocaine plain and, 1 cc kenalog 10. Disposition: Patient tolerated procedure well. Injection site dressed with a band-aid.   Post-injection care was discussed and return precautions discussed.   Trula Slade DPM

## 2019-01-20 DIAGNOSIS — M25675 Stiffness of left foot, not elsewhere classified: Secondary | ICD-10-CM | POA: Diagnosis not present

## 2019-01-20 DIAGNOSIS — M62562 Muscle wasting and atrophy, not elsewhere classified, left lower leg: Secondary | ICD-10-CM | POA: Diagnosis not present

## 2019-01-20 DIAGNOSIS — R269 Unspecified abnormalities of gait and mobility: Secondary | ICD-10-CM | POA: Diagnosis not present

## 2019-01-20 DIAGNOSIS — M25572 Pain in left ankle and joints of left foot: Secondary | ICD-10-CM | POA: Diagnosis not present

## 2019-01-20 DIAGNOSIS — M62572 Muscle wasting and atrophy, not elsewhere classified, left ankle and foot: Secondary | ICD-10-CM | POA: Diagnosis not present

## 2019-01-20 DIAGNOSIS — M059 Rheumatoid arthritis with rheumatoid factor, unspecified: Secondary | ICD-10-CM | POA: Diagnosis not present

## 2019-01-20 DIAGNOSIS — M25672 Stiffness of left ankle, not elsewhere classified: Secondary | ICD-10-CM | POA: Diagnosis not present

## 2019-01-27 DIAGNOSIS — M62572 Muscle wasting and atrophy, not elsewhere classified, left ankle and foot: Secondary | ICD-10-CM | POA: Diagnosis not present

## 2019-01-27 DIAGNOSIS — R269 Unspecified abnormalities of gait and mobility: Secondary | ICD-10-CM | POA: Diagnosis not present

## 2019-01-27 DIAGNOSIS — M059 Rheumatoid arthritis with rheumatoid factor, unspecified: Secondary | ICD-10-CM | POA: Diagnosis not present

## 2019-01-27 DIAGNOSIS — M25672 Stiffness of left ankle, not elsewhere classified: Secondary | ICD-10-CM | POA: Diagnosis not present

## 2019-01-27 DIAGNOSIS — M25572 Pain in left ankle and joints of left foot: Secondary | ICD-10-CM | POA: Diagnosis not present

## 2019-01-27 DIAGNOSIS — M25675 Stiffness of left foot, not elsewhere classified: Secondary | ICD-10-CM | POA: Diagnosis not present

## 2019-01-27 DIAGNOSIS — M62562 Muscle wasting and atrophy, not elsewhere classified, left lower leg: Secondary | ICD-10-CM | POA: Diagnosis not present

## 2019-02-03 DIAGNOSIS — M25572 Pain in left ankle and joints of left foot: Secondary | ICD-10-CM | POA: Diagnosis not present

## 2019-02-03 DIAGNOSIS — R269 Unspecified abnormalities of gait and mobility: Secondary | ICD-10-CM | POA: Diagnosis not present

## 2019-02-03 DIAGNOSIS — M25672 Stiffness of left ankle, not elsewhere classified: Secondary | ICD-10-CM | POA: Diagnosis not present

## 2019-02-03 DIAGNOSIS — M25675 Stiffness of left foot, not elsewhere classified: Secondary | ICD-10-CM | POA: Diagnosis not present

## 2019-02-03 DIAGNOSIS — M62562 Muscle wasting and atrophy, not elsewhere classified, left lower leg: Secondary | ICD-10-CM | POA: Diagnosis not present

## 2019-02-03 DIAGNOSIS — M62572 Muscle wasting and atrophy, not elsewhere classified, left ankle and foot: Secondary | ICD-10-CM | POA: Diagnosis not present

## 2019-02-03 DIAGNOSIS — M059 Rheumatoid arthritis with rheumatoid factor, unspecified: Secondary | ICD-10-CM | POA: Diagnosis not present

## 2019-02-04 DIAGNOSIS — M8588 Other specified disorders of bone density and structure, other site: Secondary | ICD-10-CM | POA: Diagnosis not present

## 2019-02-04 DIAGNOSIS — Z1231 Encounter for screening mammogram for malignant neoplasm of breast: Secondary | ICD-10-CM | POA: Diagnosis not present

## 2019-02-04 DIAGNOSIS — Z01419 Encounter for gynecological examination (general) (routine) without abnormal findings: Secondary | ICD-10-CM | POA: Diagnosis not present

## 2019-02-04 DIAGNOSIS — N958 Other specified menopausal and perimenopausal disorders: Secondary | ICD-10-CM | POA: Diagnosis not present

## 2019-02-04 DIAGNOSIS — Z6821 Body mass index (BMI) 21.0-21.9, adult: Secondary | ICD-10-CM | POA: Diagnosis not present

## 2019-02-10 DIAGNOSIS — M25672 Stiffness of left ankle, not elsewhere classified: Secondary | ICD-10-CM | POA: Diagnosis not present

## 2019-02-10 DIAGNOSIS — M25675 Stiffness of left foot, not elsewhere classified: Secondary | ICD-10-CM | POA: Diagnosis not present

## 2019-02-10 DIAGNOSIS — M25572 Pain in left ankle and joints of left foot: Secondary | ICD-10-CM | POA: Diagnosis not present

## 2019-02-10 DIAGNOSIS — M62562 Muscle wasting and atrophy, not elsewhere classified, left lower leg: Secondary | ICD-10-CM | POA: Diagnosis not present

## 2019-02-10 DIAGNOSIS — M62572 Muscle wasting and atrophy, not elsewhere classified, left ankle and foot: Secondary | ICD-10-CM | POA: Diagnosis not present

## 2019-02-10 DIAGNOSIS — M059 Rheumatoid arthritis with rheumatoid factor, unspecified: Secondary | ICD-10-CM | POA: Diagnosis not present

## 2019-02-10 DIAGNOSIS — R269 Unspecified abnormalities of gait and mobility: Secondary | ICD-10-CM | POA: Diagnosis not present

## 2019-02-11 ENCOUNTER — Ambulatory Visit (INDEPENDENT_AMBULATORY_CARE_PROVIDER_SITE_OTHER): Payer: Medicare Other | Admitting: Podiatry

## 2019-02-11 ENCOUNTER — Other Ambulatory Visit: Payer: Self-pay

## 2019-02-11 ENCOUNTER — Encounter: Payer: Self-pay | Admitting: Podiatry

## 2019-02-11 DIAGNOSIS — M722 Plantar fascial fibromatosis: Secondary | ICD-10-CM | POA: Diagnosis not present

## 2019-02-11 DIAGNOSIS — M779 Enthesopathy, unspecified: Secondary | ICD-10-CM

## 2019-02-17 DIAGNOSIS — M25675 Stiffness of left foot, not elsewhere classified: Secondary | ICD-10-CM | POA: Diagnosis not present

## 2019-02-17 DIAGNOSIS — M25672 Stiffness of left ankle, not elsewhere classified: Secondary | ICD-10-CM | POA: Diagnosis not present

## 2019-02-17 DIAGNOSIS — R269 Unspecified abnormalities of gait and mobility: Secondary | ICD-10-CM | POA: Diagnosis not present

## 2019-02-17 DIAGNOSIS — M62562 Muscle wasting and atrophy, not elsewhere classified, left lower leg: Secondary | ICD-10-CM | POA: Diagnosis not present

## 2019-02-17 DIAGNOSIS — M059 Rheumatoid arthritis with rheumatoid factor, unspecified: Secondary | ICD-10-CM | POA: Diagnosis not present

## 2019-02-17 DIAGNOSIS — M62572 Muscle wasting and atrophy, not elsewhere classified, left ankle and foot: Secondary | ICD-10-CM | POA: Diagnosis not present

## 2019-02-17 DIAGNOSIS — M25572 Pain in left ankle and joints of left foot: Secondary | ICD-10-CM | POA: Diagnosis not present

## 2019-02-22 DIAGNOSIS — R0981 Nasal congestion: Secondary | ICD-10-CM | POA: Diagnosis not present

## 2019-02-22 NOTE — Progress Notes (Signed)
Subjective: 63 year old female presents the office today for follow-up evaluation of left foot and ankle pain.  Overall she states that she is doing better and physical therapy is been helpful.  She is occasional discomfort but overall doing better.  Is been trying different shoes.  She states that she has effusion in her ankle as she was too much support is causing irritation of the outside aspect of the foot.  She also states that she purchased new shoes which is rubbing a blister on the side of her ankle.  She still using the Jublia and this is been helpful for the nails. Denies any systemic complaints such as fevers, chills, nausea, vomiting. No acute changes since last appointment, and no other complaints at this time.   Objective: AAO x3, NAD DP/PT pulses palpable bilaterally, CRT less than 3 seconds Overall is no significant discomfort on the plantar medial tubercle of the calcaneus at insertion of plantar fascia.  No significant area of discomfort.  Subjectively she started get discomfort on the peroneal tendon lateral aspect of foot due to compensation from shoe gear.  Since she change her shoes this is improved.  Small superficial abrasion lateral malleolus from rubbing inside her shoes.  No signs of infection. No open lesions or pre-ulcerative lesions.  No pain with calf compression, swelling, warmth, erythema  Assessment: Plantar fasciitis; tendonitis  Plan: -All treatment options discussed with the patient including all alternatives, risks, complications.  -Overall her foot pain is improved.  Discussion regards to shoe modifications and inserts inside of her shoes.  She is in continue physical therapy as there is been helpful as well.  Recommend antibiotic ointment under the wound left ankle.  Monitor for any signs or symptoms of infection.  Return in about 2 months (around 04/13/2019).  Trula Slade DPM

## 2019-03-03 DIAGNOSIS — M62562 Muscle wasting and atrophy, not elsewhere classified, left lower leg: Secondary | ICD-10-CM | POA: Diagnosis not present

## 2019-03-03 DIAGNOSIS — M25672 Stiffness of left ankle, not elsewhere classified: Secondary | ICD-10-CM | POA: Diagnosis not present

## 2019-03-03 DIAGNOSIS — M25675 Stiffness of left foot, not elsewhere classified: Secondary | ICD-10-CM | POA: Diagnosis not present

## 2019-03-03 DIAGNOSIS — R269 Unspecified abnormalities of gait and mobility: Secondary | ICD-10-CM | POA: Diagnosis not present

## 2019-03-03 DIAGNOSIS — M059 Rheumatoid arthritis with rheumatoid factor, unspecified: Secondary | ICD-10-CM | POA: Diagnosis not present

## 2019-03-03 DIAGNOSIS — M25572 Pain in left ankle and joints of left foot: Secondary | ICD-10-CM | POA: Diagnosis not present

## 2019-03-03 DIAGNOSIS — M62572 Muscle wasting and atrophy, not elsewhere classified, left ankle and foot: Secondary | ICD-10-CM | POA: Diagnosis not present

## 2019-03-17 DIAGNOSIS — M62562 Muscle wasting and atrophy, not elsewhere classified, left lower leg: Secondary | ICD-10-CM | POA: Diagnosis not present

## 2019-03-17 DIAGNOSIS — M25675 Stiffness of left foot, not elsewhere classified: Secondary | ICD-10-CM | POA: Diagnosis not present

## 2019-03-17 DIAGNOSIS — M62572 Muscle wasting and atrophy, not elsewhere classified, left ankle and foot: Secondary | ICD-10-CM | POA: Diagnosis not present

## 2019-03-17 DIAGNOSIS — M25672 Stiffness of left ankle, not elsewhere classified: Secondary | ICD-10-CM | POA: Diagnosis not present

## 2019-03-17 DIAGNOSIS — M25572 Pain in left ankle and joints of left foot: Secondary | ICD-10-CM | POA: Diagnosis not present

## 2019-03-17 DIAGNOSIS — R269 Unspecified abnormalities of gait and mobility: Secondary | ICD-10-CM | POA: Diagnosis not present

## 2019-03-17 DIAGNOSIS — M059 Rheumatoid arthritis with rheumatoid factor, unspecified: Secondary | ICD-10-CM | POA: Diagnosis not present

## 2019-03-31 DIAGNOSIS — M25572 Pain in left ankle and joints of left foot: Secondary | ICD-10-CM | POA: Diagnosis not present

## 2019-03-31 DIAGNOSIS — M25675 Stiffness of left foot, not elsewhere classified: Secondary | ICD-10-CM | POA: Diagnosis not present

## 2019-03-31 DIAGNOSIS — M059 Rheumatoid arthritis with rheumatoid factor, unspecified: Secondary | ICD-10-CM | POA: Diagnosis not present

## 2019-03-31 DIAGNOSIS — M62562 Muscle wasting and atrophy, not elsewhere classified, left lower leg: Secondary | ICD-10-CM | POA: Diagnosis not present

## 2019-03-31 DIAGNOSIS — R269 Unspecified abnormalities of gait and mobility: Secondary | ICD-10-CM | POA: Diagnosis not present

## 2019-03-31 DIAGNOSIS — M62572 Muscle wasting and atrophy, not elsewhere classified, left ankle and foot: Secondary | ICD-10-CM | POA: Diagnosis not present

## 2019-03-31 DIAGNOSIS — M25672 Stiffness of left ankle, not elsewhere classified: Secondary | ICD-10-CM | POA: Diagnosis not present

## 2019-04-09 ENCOUNTER — Telehealth: Payer: Self-pay | Admitting: Podiatry

## 2019-04-09 NOTE — Telephone Encounter (Signed)
Pt wanted to to let Dr. Jacqualyn Posey know that her left ankle is coming along fine and she said PT is helping a lot.

## 2019-04-12 NOTE — Telephone Encounter (Signed)
Great!

## 2019-04-13 ENCOUNTER — Ambulatory Visit: Payer: PRIVATE HEALTH INSURANCE | Admitting: Podiatry

## 2019-04-14 DIAGNOSIS — M059 Rheumatoid arthritis with rheumatoid factor, unspecified: Secondary | ICD-10-CM | POA: Diagnosis not present

## 2019-04-14 DIAGNOSIS — M62562 Muscle wasting and atrophy, not elsewhere classified, left lower leg: Secondary | ICD-10-CM | POA: Diagnosis not present

## 2019-04-14 DIAGNOSIS — R269 Unspecified abnormalities of gait and mobility: Secondary | ICD-10-CM | POA: Diagnosis not present

## 2019-04-14 DIAGNOSIS — M62572 Muscle wasting and atrophy, not elsewhere classified, left ankle and foot: Secondary | ICD-10-CM | POA: Diagnosis not present

## 2019-04-14 DIAGNOSIS — M25675 Stiffness of left foot, not elsewhere classified: Secondary | ICD-10-CM | POA: Diagnosis not present

## 2019-04-14 DIAGNOSIS — M25672 Stiffness of left ankle, not elsewhere classified: Secondary | ICD-10-CM | POA: Diagnosis not present

## 2019-04-14 DIAGNOSIS — M25572 Pain in left ankle and joints of left foot: Secondary | ICD-10-CM | POA: Diagnosis not present

## 2019-04-19 DIAGNOSIS — M62572 Muscle wasting and atrophy, not elsewhere classified, left ankle and foot: Secondary | ICD-10-CM | POA: Diagnosis not present

## 2019-04-19 DIAGNOSIS — M25675 Stiffness of left foot, not elsewhere classified: Secondary | ICD-10-CM | POA: Diagnosis not present

## 2019-04-19 DIAGNOSIS — M059 Rheumatoid arthritis with rheumatoid factor, unspecified: Secondary | ICD-10-CM | POA: Diagnosis not present

## 2019-04-19 DIAGNOSIS — M25672 Stiffness of left ankle, not elsewhere classified: Secondary | ICD-10-CM | POA: Diagnosis not present

## 2019-04-19 DIAGNOSIS — M25572 Pain in left ankle and joints of left foot: Secondary | ICD-10-CM | POA: Diagnosis not present

## 2019-04-19 DIAGNOSIS — R269 Unspecified abnormalities of gait and mobility: Secondary | ICD-10-CM | POA: Diagnosis not present

## 2019-04-19 DIAGNOSIS — M62562 Muscle wasting and atrophy, not elsewhere classified, left lower leg: Secondary | ICD-10-CM | POA: Diagnosis not present

## 2019-05-03 DIAGNOSIS — R269 Unspecified abnormalities of gait and mobility: Secondary | ICD-10-CM | POA: Diagnosis not present

## 2019-05-03 DIAGNOSIS — M25672 Stiffness of left ankle, not elsewhere classified: Secondary | ICD-10-CM | POA: Diagnosis not present

## 2019-05-03 DIAGNOSIS — M25675 Stiffness of left foot, not elsewhere classified: Secondary | ICD-10-CM | POA: Diagnosis not present

## 2019-05-03 DIAGNOSIS — M62572 Muscle wasting and atrophy, not elsewhere classified, left ankle and foot: Secondary | ICD-10-CM | POA: Diagnosis not present

## 2019-05-03 DIAGNOSIS — M059 Rheumatoid arthritis with rheumatoid factor, unspecified: Secondary | ICD-10-CM | POA: Diagnosis not present

## 2019-05-03 DIAGNOSIS — M25572 Pain in left ankle and joints of left foot: Secondary | ICD-10-CM | POA: Diagnosis not present

## 2019-05-03 DIAGNOSIS — M62562 Muscle wasting and atrophy, not elsewhere classified, left lower leg: Secondary | ICD-10-CM | POA: Diagnosis not present

## 2019-05-18 DIAGNOSIS — Z1589 Genetic susceptibility to other disease: Secondary | ICD-10-CM | POA: Diagnosis not present

## 2019-05-18 DIAGNOSIS — M150 Primary generalized (osteo)arthritis: Secondary | ICD-10-CM | POA: Diagnosis not present

## 2019-05-18 DIAGNOSIS — Z79899 Other long term (current) drug therapy: Secondary | ICD-10-CM | POA: Diagnosis not present

## 2019-05-18 DIAGNOSIS — M0890 Juvenile arthritis, unspecified, unspecified site: Secondary | ICD-10-CM | POA: Diagnosis not present

## 2019-05-18 DIAGNOSIS — M2550 Pain in unspecified joint: Secondary | ICD-10-CM | POA: Diagnosis not present

## 2019-07-30 ENCOUNTER — Ambulatory Visit: Payer: Medicare Other | Attending: Internal Medicine

## 2019-07-30 DIAGNOSIS — Z23 Encounter for immunization: Secondary | ICD-10-CM | POA: Insufficient documentation

## 2019-07-30 NOTE — Progress Notes (Signed)
   Covid-19 Vaccination Clinic  Name:  Brittany Gutierrez    MRN: [id number] DOB: [birth year]  07/30/2019  Brittany Gutierrez was observed post Covid-19 immunization for 15 minutes without incidence. She was provided with Vaccine Information Sheet and instruction to access the V-Safe system.   Brittany Gutierrez was instructed to call 911 with any severe reactions post vaccine: Marland Kitchen Difficulty breathing  . Swelling of your face and throat  . A fast heartbeat  . A bad rash all over your body  . Dizziness and weakness    Immunizations Administered    Name Date Dose VIS Date Route   Pfizer COVID-19 Vaccine 07/30/2019 11:25 AM 0.3 mL 05/14/2019 Intramuscular   Manufacturer: [place]   Lot: [id number]   [place]: [id number]

## 2019-08-24 ENCOUNTER — Ambulatory Visit: Payer: Medicare Other | Attending: Internal Medicine

## 2019-08-24 DIAGNOSIS — Z23 Encounter for immunization: Secondary | ICD-10-CM

## 2019-08-24 NOTE — Progress Notes (Signed)
   Covid-19 Vaccination Clinic  Name:  ARETHA DORNAK    MRN: [id number] DOB: [birth year]  08/24/2019  Ms. Rollie was observed post Covid-19 immunization for 15 minutes without incident. She was provided with Vaccine Information Sheet and instruction to access the V-Safe system.   Ms. Grzyb was instructed to call 911 with any severe reactions post vaccine: Marland Kitchen Difficulty breathing  . Swelling of face and throat  . A fast heartbeat  . A bad rash all over body  . Dizziness and weakness   Immunizations Administered    Name Date Dose VIS Date Route   Pfizer COVID-19 Vaccine 08/24/2019  2:13 PM 0.3 mL 05/14/2019 Intramuscular   Manufacturer: [place]   Lot: [id number]   [place]: [id number]

## 2019-08-31 DIAGNOSIS — J3489 Other specified disorders of nose and nasal sinuses: Secondary | ICD-10-CM | POA: Diagnosis not present

## 2019-08-31 DIAGNOSIS — E785 Hyperlipidemia, unspecified: Secondary | ICD-10-CM | POA: Diagnosis not present

## 2019-08-31 DIAGNOSIS — Z0000 Encounter for general adult medical examination without abnormal findings: Secondary | ICD-10-CM | POA: Diagnosis not present

## 2019-08-31 DIAGNOSIS — E559 Vitamin D deficiency, unspecified: Secondary | ICD-10-CM | POA: Diagnosis not present

## 2019-08-31 DIAGNOSIS — R7989 Other specified abnormal findings of blood chemistry: Secondary | ICD-10-CM | POA: Diagnosis not present

## 2019-09-07 DIAGNOSIS — D225 Melanocytic nevi of trunk: Secondary | ICD-10-CM | POA: Diagnosis not present

## 2019-09-07 DIAGNOSIS — L245 Irritant contact dermatitis due to other chemical products: Secondary | ICD-10-CM | POA: Diagnosis not present

## 2019-09-07 DIAGNOSIS — D1801 Hemangioma of skin and subcutaneous tissue: Secondary | ICD-10-CM | POA: Diagnosis not present

## 2019-09-07 DIAGNOSIS — Z85828 Personal history of other malignant neoplasm of skin: Secondary | ICD-10-CM | POA: Diagnosis not present

## 2019-09-07 DIAGNOSIS — I788 Other diseases of capillaries: Secondary | ICD-10-CM | POA: Diagnosis not present

## 2019-09-07 DIAGNOSIS — L821 Other seborrheic keratosis: Secondary | ICD-10-CM | POA: Diagnosis not present

## 2019-09-07 DIAGNOSIS — L814 Other melanin hyperpigmentation: Secondary | ICD-10-CM | POA: Diagnosis not present

## 2019-09-10 DIAGNOSIS — E559 Vitamin D deficiency, unspecified: Secondary | ICD-10-CM | POA: Diagnosis not present

## 2019-09-10 DIAGNOSIS — R946 Abnormal results of thyroid function studies: Secondary | ICD-10-CM | POA: Diagnosis not present

## 2019-09-10 DIAGNOSIS — J3489 Other specified disorders of nose and nasal sinuses: Secondary | ICD-10-CM | POA: Diagnosis not present

## 2019-09-10 DIAGNOSIS — E785 Hyperlipidemia, unspecified: Secondary | ICD-10-CM | POA: Diagnosis not present

## 2019-09-10 DIAGNOSIS — Z0000 Encounter for general adult medical examination without abnormal findings: Secondary | ICD-10-CM | POA: Diagnosis not present

## 2019-11-16 DIAGNOSIS — M2550 Pain in unspecified joint: Secondary | ICD-10-CM | POA: Diagnosis not present

## 2019-11-16 DIAGNOSIS — M0890 Juvenile arthritis, unspecified, unspecified site: Secondary | ICD-10-CM | POA: Diagnosis not present

## 2019-11-16 DIAGNOSIS — Z1589 Genetic susceptibility to other disease: Secondary | ICD-10-CM | POA: Diagnosis not present

## 2019-11-16 DIAGNOSIS — M150 Primary generalized (osteo)arthritis: Secondary | ICD-10-CM | POA: Diagnosis not present

## 2019-11-16 DIAGNOSIS — Z6820 Body mass index (BMI) 20.0-20.9, adult: Secondary | ICD-10-CM | POA: Diagnosis not present

## 2020-02-11 DIAGNOSIS — H2513 Age-related nuclear cataract, bilateral: Secondary | ICD-10-CM | POA: Diagnosis not present

## 2020-03-16 DIAGNOSIS — Z6822 Body mass index (BMI) 22.0-22.9, adult: Secondary | ICD-10-CM | POA: Diagnosis not present

## 2020-03-16 DIAGNOSIS — Z01419 Encounter for gynecological examination (general) (routine) without abnormal findings: Secondary | ICD-10-CM | POA: Diagnosis not present

## 2020-03-16 DIAGNOSIS — Z1231 Encounter for screening mammogram for malignant neoplasm of breast: Secondary | ICD-10-CM | POA: Diagnosis not present

## 2020-03-16 DIAGNOSIS — Z1329 Encounter for screening for other suspected endocrine disorder: Secondary | ICD-10-CM | POA: Diagnosis not present

## 2020-04-04 ENCOUNTER — Other Ambulatory Visit: Payer: Self-pay | Admitting: Podiatry

## 2020-04-04 NOTE — Telephone Encounter (Signed)
Please advise 

## 2020-04-20 DIAGNOSIS — Z23 Encounter for immunization: Secondary | ICD-10-CM | POA: Diagnosis not present

## 2020-05-05 DIAGNOSIS — J329 Chronic sinusitis, unspecified: Secondary | ICD-10-CM | POA: Diagnosis not present

## 2020-05-17 DIAGNOSIS — Z6821 Body mass index (BMI) 21.0-21.9, adult: Secondary | ICD-10-CM | POA: Diagnosis not present

## 2020-05-17 DIAGNOSIS — M2550 Pain in unspecified joint: Secondary | ICD-10-CM | POA: Diagnosis not present

## 2020-05-17 DIAGNOSIS — Z1589 Genetic susceptibility to other disease: Secondary | ICD-10-CM | POA: Diagnosis not present

## 2020-05-17 DIAGNOSIS — M150 Primary generalized (osteo)arthritis: Secondary | ICD-10-CM | POA: Diagnosis not present

## 2020-05-17 DIAGNOSIS — Z79899 Other long term (current) drug therapy: Secondary | ICD-10-CM | POA: Diagnosis not present

## 2020-05-17 DIAGNOSIS — M0890 Juvenile arthritis, unspecified, unspecified site: Secondary | ICD-10-CM | POA: Diagnosis not present

## 2020-09-05 DIAGNOSIS — Z23 Encounter for immunization: Secondary | ICD-10-CM | POA: Diagnosis not present

## 2020-09-05 DIAGNOSIS — E785 Hyperlipidemia, unspecified: Secondary | ICD-10-CM | POA: Diagnosis not present

## 2020-09-05 DIAGNOSIS — Z0000 Encounter for general adult medical examination without abnormal findings: Secondary | ICD-10-CM | POA: Diagnosis not present

## 2020-09-05 DIAGNOSIS — Z1211 Encounter for screening for malignant neoplasm of colon: Secondary | ICD-10-CM | POA: Diagnosis not present

## 2020-09-05 DIAGNOSIS — R7989 Other specified abnormal findings of blood chemistry: Secondary | ICD-10-CM | POA: Diagnosis not present

## 2020-09-05 DIAGNOSIS — E559 Vitamin D deficiency, unspecified: Secondary | ICD-10-CM | POA: Diagnosis not present

## 2020-09-11 DIAGNOSIS — B351 Tinea unguium: Secondary | ICD-10-CM | POA: Diagnosis not present

## 2020-09-11 DIAGNOSIS — Z85828 Personal history of other malignant neoplasm of skin: Secondary | ICD-10-CM | POA: Diagnosis not present

## 2020-09-11 DIAGNOSIS — L814 Other melanin hyperpigmentation: Secondary | ICD-10-CM | POA: Diagnosis not present

## 2020-09-11 DIAGNOSIS — L240 Irritant contact dermatitis due to detergents: Secondary | ICD-10-CM | POA: Diagnosis not present

## 2020-09-11 DIAGNOSIS — L821 Other seborrheic keratosis: Secondary | ICD-10-CM | POA: Diagnosis not present

## 2020-09-11 DIAGNOSIS — D1801 Hemangioma of skin and subcutaneous tissue: Secondary | ICD-10-CM | POA: Diagnosis not present

## 2020-09-11 DIAGNOSIS — D225 Melanocytic nevi of trunk: Secondary | ICD-10-CM | POA: Diagnosis not present

## 2020-09-22 DIAGNOSIS — J0191 Acute recurrent sinusitis, unspecified: Secondary | ICD-10-CM | POA: Diagnosis not present

## 2020-09-22 DIAGNOSIS — J069 Acute upper respiratory infection, unspecified: Secondary | ICD-10-CM | POA: Diagnosis not present

## 2020-10-06 DIAGNOSIS — Z1211 Encounter for screening for malignant neoplasm of colon: Secondary | ICD-10-CM | POA: Diagnosis not present

## 2020-10-06 DIAGNOSIS — E785 Hyperlipidemia, unspecified: Secondary | ICD-10-CM | POA: Diagnosis not present

## 2020-10-06 DIAGNOSIS — R946 Abnormal results of thyroid function studies: Secondary | ICD-10-CM | POA: Diagnosis not present

## 2020-10-06 DIAGNOSIS — Z0000 Encounter for general adult medical examination without abnormal findings: Secondary | ICD-10-CM | POA: Diagnosis not present

## 2020-10-06 DIAGNOSIS — E559 Vitamin D deficiency, unspecified: Secondary | ICD-10-CM | POA: Diagnosis not present

## 2020-11-15 DIAGNOSIS — Z79899 Other long term (current) drug therapy: Secondary | ICD-10-CM | POA: Diagnosis not present

## 2020-11-15 DIAGNOSIS — M2550 Pain in unspecified joint: Secondary | ICD-10-CM | POA: Diagnosis not present

## 2020-11-15 DIAGNOSIS — M25572 Pain in left ankle and joints of left foot: Secondary | ICD-10-CM | POA: Diagnosis not present

## 2020-11-15 DIAGNOSIS — M150 Primary generalized (osteo)arthritis: Secondary | ICD-10-CM | POA: Diagnosis not present

## 2020-11-15 DIAGNOSIS — M0890 Juvenile arthritis, unspecified, unspecified site: Secondary | ICD-10-CM | POA: Diagnosis not present

## 2020-11-15 DIAGNOSIS — Z6821 Body mass index (BMI) 21.0-21.9, adult: Secondary | ICD-10-CM | POA: Diagnosis not present

## 2020-11-15 DIAGNOSIS — Z1589 Genetic susceptibility to other disease: Secondary | ICD-10-CM | POA: Diagnosis not present

## 2021-03-17 DIAGNOSIS — H6983 Other specified disorders of Eustachian tube, bilateral: Secondary | ICD-10-CM | POA: Diagnosis not present

## 2021-03-17 DIAGNOSIS — H6592 Unspecified nonsuppurative otitis media, left ear: Secondary | ICD-10-CM | POA: Diagnosis not present

## 2021-03-22 DIAGNOSIS — H6591 Unspecified nonsuppurative otitis media, right ear: Secondary | ICD-10-CM | POA: Diagnosis not present

## 2021-03-22 DIAGNOSIS — Z6822 Body mass index (BMI) 22.0-22.9, adult: Secondary | ICD-10-CM | POA: Diagnosis not present

## 2021-05-02 DIAGNOSIS — Z01419 Encounter for gynecological examination (general) (routine) without abnormal findings: Secondary | ICD-10-CM | POA: Diagnosis not present

## 2021-05-02 DIAGNOSIS — Z6822 Body mass index (BMI) 22.0-22.9, adult: Secondary | ICD-10-CM | POA: Diagnosis not present

## 2021-05-05 DIAGNOSIS — B349 Viral infection, unspecified: Secondary | ICD-10-CM | POA: Diagnosis not present

## 2021-05-07 DIAGNOSIS — R0981 Nasal congestion: Secondary | ICD-10-CM | POA: Diagnosis not present

## 2021-05-07 DIAGNOSIS — B349 Viral infection, unspecified: Secondary | ICD-10-CM | POA: Diagnosis not present

## 2021-05-12 DIAGNOSIS — R062 Wheezing: Secondary | ICD-10-CM | POA: Diagnosis not present

## 2021-05-12 DIAGNOSIS — J22 Unspecified acute lower respiratory infection: Secondary | ICD-10-CM | POA: Diagnosis not present

## 2021-05-18 DIAGNOSIS — M2550 Pain in unspecified joint: Secondary | ICD-10-CM | POA: Diagnosis not present

## 2021-05-18 DIAGNOSIS — M150 Primary generalized (osteo)arthritis: Secondary | ICD-10-CM | POA: Diagnosis not present

## 2021-05-18 DIAGNOSIS — Z1589 Genetic susceptibility to other disease: Secondary | ICD-10-CM | POA: Diagnosis not present

## 2021-05-18 DIAGNOSIS — Z6821 Body mass index (BMI) 21.0-21.9, adult: Secondary | ICD-10-CM | POA: Diagnosis not present

## 2021-05-18 DIAGNOSIS — M0890 Juvenile arthritis, unspecified, unspecified site: Secondary | ICD-10-CM | POA: Diagnosis not present

## 2021-05-18 DIAGNOSIS — Z79899 Other long term (current) drug therapy: Secondary | ICD-10-CM | POA: Diagnosis not present

## 2021-06-18 ENCOUNTER — Other Ambulatory Visit: Payer: Self-pay | Admitting: Podiatry

## 2021-09-12 DIAGNOSIS — E785 Hyperlipidemia, unspecified: Secondary | ICD-10-CM | POA: Diagnosis not present

## 2021-09-12 DIAGNOSIS — Z0000 Encounter for general adult medical examination without abnormal findings: Secondary | ICD-10-CM | POA: Diagnosis not present

## 2021-09-12 DIAGNOSIS — R7989 Other specified abnormal findings of blood chemistry: Secondary | ICD-10-CM | POA: Diagnosis not present

## 2021-09-12 DIAGNOSIS — Z1211 Encounter for screening for malignant neoplasm of colon: Secondary | ICD-10-CM | POA: Diagnosis not present

## 2021-09-12 DIAGNOSIS — E559 Vitamin D deficiency, unspecified: Secondary | ICD-10-CM | POA: Diagnosis not present

## 2021-10-01 DIAGNOSIS — R609 Edema, unspecified: Secondary | ICD-10-CM | POA: Diagnosis not present

## 2021-10-01 DIAGNOSIS — Z85828 Personal history of other malignant neoplasm of skin: Secondary | ICD-10-CM | POA: Diagnosis not present

## 2021-10-01 DIAGNOSIS — L814 Other melanin hyperpigmentation: Secondary | ICD-10-CM | POA: Diagnosis not present

## 2021-10-01 DIAGNOSIS — D485 Neoplasm of uncertain behavior of skin: Secondary | ICD-10-CM | POA: Diagnosis not present

## 2021-10-01 DIAGNOSIS — D1801 Hemangioma of skin and subcutaneous tissue: Secondary | ICD-10-CM | POA: Diagnosis not present

## 2021-10-01 DIAGNOSIS — D225 Melanocytic nevi of trunk: Secondary | ICD-10-CM | POA: Diagnosis not present

## 2021-10-01 DIAGNOSIS — L821 Other seborrheic keratosis: Secondary | ICD-10-CM | POA: Diagnosis not present

## 2021-11-16 DIAGNOSIS — Z6820 Body mass index (BMI) 20.0-20.9, adult: Secondary | ICD-10-CM | POA: Diagnosis not present

## 2021-11-16 DIAGNOSIS — M0890 Juvenile arthritis, unspecified, unspecified site: Secondary | ICD-10-CM | POA: Diagnosis not present

## 2021-11-16 DIAGNOSIS — Z79899 Other long term (current) drug therapy: Secondary | ICD-10-CM | POA: Diagnosis not present

## 2021-11-16 DIAGNOSIS — M25572 Pain in left ankle and joints of left foot: Secondary | ICD-10-CM | POA: Diagnosis not present

## 2021-11-16 DIAGNOSIS — M1991 Primary osteoarthritis, unspecified site: Secondary | ICD-10-CM | POA: Diagnosis not present

## 2021-11-16 DIAGNOSIS — Z1589 Genetic susceptibility to other disease: Secondary | ICD-10-CM | POA: Diagnosis not present

## 2021-11-19 DIAGNOSIS — R7989 Other specified abnormal findings of blood chemistry: Secondary | ICD-10-CM | POA: Diagnosis not present

## 2021-11-19 DIAGNOSIS — R946 Abnormal results of thyroid function studies: Secondary | ICD-10-CM | POA: Diagnosis not present

## 2021-11-19 DIAGNOSIS — E785 Hyperlipidemia, unspecified: Secondary | ICD-10-CM | POA: Diagnosis not present

## 2021-11-19 DIAGNOSIS — E559 Vitamin D deficiency, unspecified: Secondary | ICD-10-CM | POA: Diagnosis not present

## 2022-04-30 DIAGNOSIS — Z6821 Body mass index (BMI) 21.0-21.9, adult: Secondary | ICD-10-CM | POA: Diagnosis not present

## 2022-04-30 DIAGNOSIS — R300 Dysuria: Secondary | ICD-10-CM | POA: Diagnosis not present

## 2022-05-10 DIAGNOSIS — M1991 Primary osteoarthritis, unspecified site: Secondary | ICD-10-CM | POA: Diagnosis not present

## 2022-05-10 DIAGNOSIS — Z1589 Genetic susceptibility to other disease: Secondary | ICD-10-CM | POA: Diagnosis not present

## 2022-05-10 DIAGNOSIS — Z6821 Body mass index (BMI) 21.0-21.9, adult: Secondary | ICD-10-CM | POA: Diagnosis not present

## 2022-05-10 DIAGNOSIS — M0890 Juvenile arthritis, unspecified, unspecified site: Secondary | ICD-10-CM | POA: Diagnosis not present

## 2022-05-10 DIAGNOSIS — Z79899 Other long term (current) drug therapy: Secondary | ICD-10-CM | POA: Diagnosis not present

## 2022-09-16 DIAGNOSIS — R946 Abnormal results of thyroid function studies: Secondary | ICD-10-CM | POA: Diagnosis not present

## 2022-09-16 DIAGNOSIS — E559 Vitamin D deficiency, unspecified: Secondary | ICD-10-CM | POA: Diagnosis not present

## 2022-09-16 DIAGNOSIS — Z6822 Body mass index (BMI) 22.0-22.9, adult: Secondary | ICD-10-CM | POA: Diagnosis not present

## 2022-09-16 DIAGNOSIS — Z0000 Encounter for general adult medical examination without abnormal findings: Secondary | ICD-10-CM | POA: Diagnosis not present

## 2022-09-16 DIAGNOSIS — E785 Hyperlipidemia, unspecified: Secondary | ICD-10-CM | POA: Diagnosis not present

## 2022-09-16 DIAGNOSIS — N393 Stress incontinence (female) (male): Secondary | ICD-10-CM | POA: Diagnosis not present

## 2022-09-27 DIAGNOSIS — R051 Acute cough: Secondary | ICD-10-CM | POA: Diagnosis not present

## 2022-09-27 DIAGNOSIS — Z6821 Body mass index (BMI) 21.0-21.9, adult: Secondary | ICD-10-CM | POA: Diagnosis not present

## 2022-10-14 DIAGNOSIS — R0981 Nasal congestion: Secondary | ICD-10-CM | POA: Diagnosis not present

## 2022-10-14 DIAGNOSIS — Z6821 Body mass index (BMI) 21.0-21.9, adult: Secondary | ICD-10-CM | POA: Diagnosis not present

## 2022-11-05 ENCOUNTER — Other Ambulatory Visit: Payer: Self-pay

## 2022-11-05 ENCOUNTER — Encounter: Payer: Self-pay | Admitting: Physical Therapy

## 2022-11-05 ENCOUNTER — Ambulatory Visit: Payer: Medicare Other | Attending: Family Medicine | Admitting: Physical Therapy

## 2022-11-05 DIAGNOSIS — R279 Unspecified lack of coordination: Secondary | ICD-10-CM

## 2022-11-05 DIAGNOSIS — R293 Abnormal posture: Secondary | ICD-10-CM

## 2022-11-05 DIAGNOSIS — M6281 Muscle weakness (generalized): Secondary | ICD-10-CM

## 2022-11-05 NOTE — Patient Instructions (Signed)
Bladder Irritants ° °Certain foods and beverages can be irritating to the bladder.  Avoiding these irritants may decrease your symptoms of urinary urgency, frequency or bladder pain.  Even reducing your intake can help with your symptoms.  Not everyone is sensitive to all bladder irritants, so you may consider focusing on one irritant at a time, removing or reducing your intake of that irritant for 7-10 days to see if this change helps your symptoms.  Water intake is also very important. ° °Below is a list of bladder irritants. ° °Drinks: alcohol, carbonated beverages, caffeinated beverages such as coffee and tea, drinks with artificial sweeteners, citrus juices, apple juice, tomato juice ° °Foods: tomatoes and tomato based foods, spicy food, sugar and artificial sweeteners, vinegar, chocolate, raw onion, apples, citrus fruits, pineapple, cranberries, tomatoes, strawberries, plums, peaches, cantaloupe ° °Other: acidic urine (too concentrated) - see water intake info below ° °Substitutes you can try that are NOT irritating to the bladder: cooked onion, pears, papayas, sun-brewed decaf teas, watermelons, non-citrus herbal teas, apricots, kava and low-acid instant drinks (Postum). ° ° ° °WATER INTAKE: Remember to drink lots of water (aim for fluid intake of half your body weight with 2/3 of fluids being water).  You may be limiting fluids due to fear of leakage, but this can actually worsen urgency symptoms due to highly concentrated urine.  Water helps balance the pH of your urine so it doesn't become too acidic - acidic urine is a bladder irritant! ° ° °Urge Incontinence ° °Ideal urination frequency is every 2-4 wakeful hours, which equates to 5-8 times within a 24-hour period.   °Urge incontinence is leakage that occurs when the bladder muscle contracts, creating a sudden need to go before getting to the bathroom.   °Going too often when your bladder isn't actually full can disrupt the body's automatic signals to  store and hold urine longer, which will increase urgency/frequency.  In this case, the bladder “is running the show” and strategies can be learned to retrain this pattern.   °One should be able to control the first urge to urinate, at around 150mL.  The bladder can hold up to a “grande latte,” or 400mL. °To help you gain control, practice the Urge Drill below when urgency strikes.  This drill will help retrain your bladder signals and allow you to store and hold urine longer.  The overall goal is to stretch out your time between voids to reach a more manageable voiding schedule.    °Practice your "quick flicks" often throughout the day (each waking hour) even when you don't need feel the urge to go.  This will help strengthen your pelvic floor muscles, making them more effective in controlling leakage. ° °Urge Drill ° °When you feel an urge to go, follow these steps to regain control: °Stop what you are doing and be still °Take one deep breath, directing your air into your abdomen °Think an affirming thought, such as “I've got this.” °Do 5 quick flicks of your pelvic floor °Walk with control to the bathroom to void, or delay voiding ° ° ° °THE KNACK ° °The Knack is a strategy you may use to help to reduce or prevent leakage or passing of urine, gas or feces during an activity that causes downward force on the pelvic floor muscles.   ° °Activities that can cause downward pressure on the pelvic floor muscles include coughing, sneezing, laughing, bending, lifting, and transitioning from different body positions such as from laying down to   sitting up and sitting to standing. ° °To perform The Knack, consciously squeeze and lift your pelvic floor muscles to perform a strong, well-timed pelvic muscle contraction BEFORE AND DURING these activities above.  As your contraction gets more coordinated and your muscles get stronger, you will become more effective in controlling your experience of incontinence or gas passing  during these activities.   ° ° °

## 2022-11-05 NOTE — Therapy (Signed)
OUTPATIENT PHYSICAL THERAPY FEMALE PELVIC EVALUATION   Patient Name: Brittany Gutierrez MRN: [id number] DOB:09/02/1955, 67 y.o., female Today's Date: 11/05/2022  END OF SESSION:  PT End of Session - 11/05/22 1220     Visit Number 1    Date for PT Re-Evaluation 02/05/23    Authorization Type medicare    Progress Note Due on Visit 10    PT Start Time 1230    PT Stop Time 1310    PT Time Calculation (min) 40 min    Activity Tolerance Patient tolerated treatment well    Behavior During Therapy WFL for tasks assessed/performed             Past Medical History:  Diagnosis Date   Allergy    seasonal allergies   RA (rheumatoid arthritis) (HCC)    juvenile   Past Surgical History:  Procedure Laterality Date   ANKLE FUSION  1997   left   INNER EAR SURGERY  1979   SYNOVECTOMY FOOT     left   Patient Active Problem List   Diagnosis Date Noted   Plantar fasciitis 01/18/2019   Pain in limb 01/01/2012   Varicose veins of lower extremities with other complications 01/01/2012    PCP: Catha Gosselin, MD  REFERRING PROVIDER: Jarrett Soho, PA-C    REFERRING DIAG: N39.3 (ICD-10-CM) - Stress incontinence of urine  THERAPY DIAG:  Muscle weakness (generalized)  Abnormal posture  Unspecified lack of coordination  Rationale for Evaluation and Treatment: Rehabilitation  ONSET DATE: over a year  SUBJECTIVE:                                                                                                                                                                                           SUBJECTIVE STATEMENT: Urgency with leakage, sneezing, coughing, laughing, jumping consistently.  Decreased fluids attempting to no have to go the bathroom frequently   Fluid intake: Yes: water - 1-1.5 bottles per day, "a lot of hot tea", multiple seltzer waters, coffee in am     PAIN:  Are you having pain? No   PRECAUTIONS: None  WEIGHT BEARING RESTRICTIONS: No  FALLS:  Has  patient fallen in last 6 months? No  LIVING ENVIRONMENT: Lives with: lives with their family Lives in: House/apartment  OCCUPATION: preschool teacher   PLOF: Independent  PATIENT GOALS: to have less leakage  PERTINENT HISTORY:  Lt ankle fusion, RA,  Sexual abuse: No  BOWEL MOVEMENT: Pain with bowel movement: No Type of bowel movement:Type (Bristol Stool Scale) 4, Frequency daily, and Strain Yes Fully empty rectum: Yes:   Leakage: No Pads: No Fiber supplement: No  URINATION: Pain with urination: No Fully empty bladder: Yes: sometimes strains  Stream: Strong Urgency: Yes:   Frequency: not quicker than every 2 hours, 1x night Leakage: Urge to void, Coughing, Sneezing, Laughing, and Exercise Pads: Yes: all the time, sometimes would change a pad to have a new one but not due to leakage  INTERCOURSE: Pain with intercourse:  not painful Ability to have vaginal penetration:  Yes:   Climax: not painful Marinoff Scale: 0/3  PREGNANCY: Vaginal deliveries 3 Tearing Yes: episiotomy with at least first  C-section deliveries 0 Currently pregnant No  PROLAPSE: None   OBJECTIVE:   DIAGNOSTIC FINDINGS:    COGNITION: Overall cognitive status: Within functional limits for tasks assessed     SENSATION: Light touch: Appears intact Proprioception: Appears intact  MUSCLE LENGTH: Bil hamstrings and adductors limited by 25%   POSTURE: rounded shoulders, forward head, and posterior pelvic tilt  PELVIC ALIGNMENT: WFL  LUMBARAROM/PROM:  A/PROM A/PROM  eval  Flexion Limited by 25%  Extension WFL  Right lateral flexion Limited by 25%  Left lateral flexion Limited by 25%  Right rotation Limited by 25%  Left rotation Limited by 25%   (Blank rows = not tested)  LOWER EXTREMITY ROM:  WFL  LOWER EXTREMITY MMT:  Bil hip abduction 3/5, hip flexion 3+/5, extension 3+/5, adduction 3+/5; knees 5/5 PALPATION:   General  no TTP                External Perineal Exam pt  deferred                              Internal Pelvic Floor pt deferred   Patient confirms identification and approves PT to assess internal pelvic floor and treatment No  PELVIC MMT:   MMT eval  Vaginal   Internal Anal Sphincter   External Anal Sphincter   Puborectalis   Diastasis Recti   (Blank rows = not tested)        TONE: Pt deferred  PROLAPSE: Pt deferred   TODAY'S TREATMENT:                                                                                                                              DATE:   11/05/22 Examination completed, findings reviewed, pt educated on POC, bladder irritants, urge drill, knack. Pt motivated to participate in PT and agreeable to attempt recommendations.     PATIENT EDUCATION:  Education details: bladder irritants, urge drill, knack Person educated: Patient Education method: Explanation, Demonstration, Tactile cues, and Verbal cues Education comprehension: verbalized understanding and returned demonstration  HOME EXERCISE PROGRAM: To be given  ASSESSMENT:  CLINICAL IMPRESSION: Patient is a 67 y.o. female  who was seen today for physical therapy evaluation and treatment for urinary leakage. Pt recently retired from [employer] and reports sometimes she wasn't able to go to the bathroom often at work, now she has  had worsening/more frequent leakage with very short warning once she gets an urge to urinate and will have leakage with stressors as well such as sneezing/laughing/coughing. Pt deferred internal assessment today requesting to wait until she sees her gyno. Pt found to have mild decreased flexibility in bil hips and spine, decreased bil hip strength and core weakness. No pain. Pt does endorse she will hold urine for extended times intermittently, worse when teaching, and reports several bladder irritants drank throughout the day and often pushes out urine to make sure she isn't retaining any. Pt educated on urge drill,  bladder irritants and the knack. Pt would benefit from additional PT to further address deficits.     OBJECTIVE IMPAIRMENTS: decreased coordination, decreased endurance, decreased mobility, decreased strength, impaired flexibility, improper body mechanics, and postural dysfunction.   ACTIVITY LIMITATIONS: carrying, lifting, squatting, and continence  PARTICIPATION LIMITATIONS: community activity and occupation  PERSONAL FACTORS: Time since onset of injury/illness/exacerbation and 1 comorbidity: x3 vaginal births with at least one episiotomy  are also affecting patient's functional outcome.   REHAB POTENTIAL: Good  CLINICAL DECISION MAKING: Stable/uncomplicated  EVALUATION COMPLEXITY: Low   GOALS: Goals reviewed with patient? Yes  SHORT TERM GOALS: Target date: 12/03/22  Pt to be I with HEP.  Baseline: Goal status: INITIAL  2.  Pt will have 25% less urgency due to bladder retraining and strengthening  Baseline:  Goal status: INITIAL  3.  Pt to be I with knack with stressors to decrease urine leakage.  Baseline:  Goal status: INITIAL  4.  Pt to be I with urge drill for improved leakage with urgency. Baseline:  Goal status: INITIAL   LONG TERM GOALS: Target date: 02/05/23  Pt to be I with advanced HEP.  Baseline:  Goal status: INITIAL  2.  Pt will have 50% less urgency due to bladder retraining and strengthening  Baseline:  Goal status: INITIAL  3.  Pt to report improved time between bladder voids to at least 2-3 hours for improved QOL with decreased urinary frequency and voiding to limit over stretching of bladder.   Baseline:  Baseline:  Goal status: INITIAL  4.  Pt to demonstrate at least 4/5 pelvic floor strength for improved pelvic stability and decreased strain at pelvic floor/ decrease leakage.  Baseline:  Goal status: INITIAL  5.  Pt to demonstrate improved coordination of pelvic floor and breathing mechanics with 20# squat with appropriate synergistic  patterns to decrease pain and leakage at least 75% of the time.    Baseline:  Goal status: INITIAL  PLAN:  PT FREQUENCY: 1x/week  PT DURATION:  8 sessions  PLANNED INTERVENTIONS: Therapeutic exercises, Therapeutic activity, Neuromuscular re-education, Patient/Family education, Self Care, Joint mobilization, Dry Needling, Spinal mobilization, Cryotherapy, Moist heat, scar mobilization, Taping, Biofeedback, and Manual therapy  PLAN FOR NEXT SESSION: internal if needed and pt consents, strengthening core/hips/pelvic floor, mobility in spine and hips, coordination of pelvic floor and breathing with activity   Otelia Sergeant, PT, DPT 06/04/243:33 PM

## 2022-11-07 DIAGNOSIS — Z1589 Genetic susceptibility to other disease: Secondary | ICD-10-CM | POA: Diagnosis not present

## 2022-11-07 DIAGNOSIS — Z6821 Body mass index (BMI) 21.0-21.9, adult: Secondary | ICD-10-CM | POA: Diagnosis not present

## 2022-11-07 DIAGNOSIS — Z79899 Other long term (current) drug therapy: Secondary | ICD-10-CM | POA: Diagnosis not present

## 2022-11-07 DIAGNOSIS — M25572 Pain in left ankle and joints of left foot: Secondary | ICD-10-CM | POA: Diagnosis not present

## 2022-11-07 DIAGNOSIS — M0890 Juvenile arthritis, unspecified, unspecified site: Secondary | ICD-10-CM | POA: Diagnosis not present

## 2022-11-07 DIAGNOSIS — M1991 Primary osteoarthritis, unspecified site: Secondary | ICD-10-CM | POA: Diagnosis not present

## 2022-11-19 ENCOUNTER — Ambulatory Visit: Payer: Medicare Other | Admitting: Physical Therapy

## 2022-11-19 DIAGNOSIS — R293 Abnormal posture: Secondary | ICD-10-CM | POA: Diagnosis not present

## 2022-11-19 DIAGNOSIS — R279 Unspecified lack of coordination: Secondary | ICD-10-CM

## 2022-11-19 DIAGNOSIS — M6281 Muscle weakness (generalized): Secondary | ICD-10-CM

## 2022-11-19 NOTE — Therapy (Signed)
OUTPATIENT PHYSICAL THERAPY FEMALE PELVIC TREATMENT   Patient Name: Brittany Gutierrez MRN: [id number] DOB:17-Jan-1956, 67 y.o., female Today's Date: 11/19/2022  END OF SESSION:  PT End of Session - 11/19/22 0847     Visit Number 2    Date for PT Re-Evaluation 02/05/23    Authorization Type medicare    Progress Note Due on Visit 10    PT Start Time [id number]    PT Stop Time 0926    PT Time Calculation (min) 40 min    Activity Tolerance Patient tolerated treatment well    Behavior During Therapy WFL for tasks assessed/performed             Past Medical History:  Diagnosis Date   Allergy    seasonal allergies   RA (rheumatoid arthritis) (HCC)    juvenile   Past Surgical History:  Procedure Laterality Date   ANKLE FUSION  1997   left   INNER EAR SURGERY  1979   SYNOVECTOMY FOOT     left   Patient Active Problem List   Diagnosis Date Noted   Plantar fasciitis 01/18/2019   Pain in limb 01/01/2012   Varicose veins of lower extremities with other complications 01/01/2012    PCP: Catha Gosselin, MD  REFERRING PROVIDER: Jarrett Soho, PA-C    REFERRING DIAG: N39.3 (ICD-10-CM) - Stress incontinence of urine  THERAPY DIAG:  Muscle weakness (generalized)  Abnormal posture  Unspecified lack of coordination  Rationale for Evaluation and Treatment: Rehabilitation  ONSET DATE: over a year  SUBJECTIVE:                                                                                                                                                                                           SUBJECTIVE STATEMENT: Leakage about the same as eval  Decreased fluids attempting to no have to go the bathroom frequently   Fluid intake: Yes: water - 1-1.5 bottles per day, "a lot of hot tea", multiple seltzer waters, coffee in am     PAIN:  Are you having pain? No   PRECAUTIONS: None  WEIGHT BEARING RESTRICTIONS: No  FALLS:  Has patient fallen in last 6 months? No  LIVING  ENVIRONMENT: Lives with: lives with their family Lives in: House/apartment  OCCUPATION: preschool teacher   PLOF: Independent  PATIENT GOALS: to have less leakage  PERTINENT HISTORY:  Lt ankle fusion, RA,  Sexual abuse: No  BOWEL MOVEMENT: Pain with bowel movement: No Type of bowel movement:Type (Bristol Stool Scale) 4, Frequency daily, and Strain Yes Fully empty rectum: Yes:   Leakage: No Pads: No Fiber supplement: No  URINATION: Pain  with urination: No Fully empty bladder: Yes: sometimes strains  Stream: Strong Urgency: Yes:   Frequency: not quicker than every 2 hours, 1x night Leakage: Urge to void, Coughing, Sneezing, Laughing, and Exercise Pads: Yes: all the time, sometimes would change a pad to have a new one but not due to leakage  INTERCOURSE: Pain with intercourse:  not painful Ability to have vaginal penetration:  Yes:   Climax: not painful Marinoff Scale: 0/3  PREGNANCY: Vaginal deliveries 3 Tearing Yes: episiotomy with at least first  C-section deliveries 0 Currently pregnant No  PROLAPSE: None   OBJECTIVE:   DIAGNOSTIC FINDINGS:    COGNITION: Overall cognitive status: Within functional limits for tasks assessed     SENSATION: Light touch: Appears intact Proprioception: Appears intact  MUSCLE LENGTH: Bil hamstrings and adductors limited by 25%   POSTURE: rounded shoulders, forward head, and posterior pelvic tilt  PELVIC ALIGNMENT: WFL  LUMBARAROM/PROM:  A/PROM A/PROM  eval  Flexion Limited by 25%  Extension WFL  Right lateral flexion Limited by 25%  Left lateral flexion Limited by 25%  Right rotation Limited by 25%  Left rotation Limited by 25%   (Blank rows = not tested)  LOWER EXTREMITY ROM:  WFL  LOWER EXTREMITY MMT:  Bil hip abduction 3/5, hip flexion 3+/5, extension 3+/5, adduction 3+/5; knees 5/5 PALPATION:   General  no TTP                External Perineal Exam mild dryness noted, no TTP                              Internal Pelvic Floor no TTP  Patient confirms identification and approves PT to assess internal pelvic floor and treatment Yes  PELVIC MMT:   MMT eval  Vaginal 2/5, 2s, 4 reps - cues for technique, great difficulty with not bearing down   Internal Anal Sphincter   External Anal Sphincter   Puborectalis   Diastasis Recti   (Blank rows = not tested)        TONE: Decreased   PROLAPSE: Possible grade two anterior vaginal wall laxity in hooklying with cough  TODAY'S TREATMENT:                                                                                                                              DATE:   11/19/22: Patient consented to internal pelvic floor assessment vaginally this date and found to have decreased strength, endurance, and coordination. Patient benefited from verbal cues for improved technique with pelvic floor contractions and breathing coordination. Pt had difficulty with not bearing down, multiple reps and cues to improve with technique with intermittent improvement. After this, pt dressed with privacy and PT returned to room. Pt educated on findings. Pt also directed in x10 pelvic floor contractions with sitting on towel roll and pt reports she feels this much easier than lying down  or sitting normally. Pt given HEP and reviewed as well.    PATIENT EDUCATION:  Education details: bladder irritants, urge drill, knack, PJTNVCKW Person educated: Patient Education method: Explanation, Demonstration, Tactile cues, and Verbal cues Education comprehension: verbalized understanding and returned demonstration  HOME EXERCISE PROGRAM: PJTNVCKW  ASSESSMENT:  CLINICAL IMPRESSION: Patient presents for treatment, focus on internal assessment and giving HEP based on findings. Pt found to have decreased strength, endurance, coordination, and anterior wall laxity. Pt already has schedule for women's exam soon and plans to ask about possible laxity there as well. Pt  educated on HEP and denied additional questions. Pt needed max cues and extra time during internal vaginal assessment as pt demonstrates bearing down and great difficulty with proper pelvic floor contraction technique. Pt had intermittent proper technique but inconsistent. Pt would benefit from additional PT to further address deficits.     OBJECTIVE IMPAIRMENTS: decreased coordination, decreased endurance, decreased mobility, decreased strength, impaired flexibility, improper body mechanics, and postural dysfunction.   ACTIVITY LIMITATIONS: carrying, lifting, squatting, and continence  PARTICIPATION LIMITATIONS: community activity and occupation  PERSONAL FACTORS: Time since onset of injury/illness/exacerbation and 1 comorbidity: x3 vaginal births with at least one episiotomy  are also affecting patient's functional outcome.   REHAB POTENTIAL: Good  CLINICAL DECISION MAKING: Stable/uncomplicated  EVALUATION COMPLEXITY: Low   GOALS: Goals reviewed with patient? Yes  SHORT TERM GOALS: Target date: 12/03/22  Pt to be I with HEP.  Baseline: Goal status: INITIAL  2.  Pt will have 25% less urgency due to bladder retraining and strengthening  Baseline:  Goal status: INITIAL  3.  Pt to be I with knack with stressors to decrease urine leakage.  Baseline:  Goal status: INITIAL  4.  Pt to be I with urge drill for improved leakage with urgency. Baseline:  Goal status: INITIAL   LONG TERM GOALS: Target date: 02/05/23  Pt to be I with advanced HEP.  Baseline:  Goal status: INITIAL  2.  Pt will have 50% less urgency due to bladder retraining and strengthening  Baseline:  Goal status: INITIAL  3.  Pt to report improved time between bladder voids to at least 2-3 hours for improved QOL with decreased urinary frequency and voiding to limit over stretching of bladder.   Baseline:  Baseline:  Goal status: INITIAL  4.  Pt to demonstrate at least 4/5 pelvic floor strength for improved  pelvic stability and decreased strain at pelvic floor/ decrease leakage.  Baseline:  Goal status: INITIAL  5.  Pt to demonstrate improved coordination of pelvic floor and breathing mechanics with 20# squat with appropriate synergistic patterns to decrease pain and leakage at least 75% of the time.    Baseline:  Goal status: INITIAL  PLAN:  PT FREQUENCY: 1x/week  PT DURATION:  8 sessions  PLANNED INTERVENTIONS: Therapeutic exercises, Therapeutic activity, Neuromuscular re-education, Patient/Family education, Self Care, Joint mobilization, Dry Needling, Spinal mobilization, Cryotherapy, Moist heat, scar mobilization, Taping, Biofeedback, and Manual therapy  PLAN FOR NEXT SESSION: internal if needed and pt consents, strengthening core/hips/pelvic floor, mobility in spine and hips, coordination of pelvic floor and breathing with activity   Otelia Sergeant, PT, DPT 11/18/2408:16 AM

## 2022-11-21 ENCOUNTER — Other Ambulatory Visit: Payer: Self-pay | Admitting: Podiatry

## 2022-11-21 NOTE — Telephone Encounter (Signed)
Patient needs appointment since it's been over a year since last seen for this issue

## 2022-12-09 DIAGNOSIS — I8393 Asymptomatic varicose veins of bilateral lower extremities: Secondary | ICD-10-CM | POA: Diagnosis not present

## 2022-12-09 DIAGNOSIS — H02403 Unspecified ptosis of bilateral eyelids: Secondary | ICD-10-CM | POA: Diagnosis not present

## 2022-12-09 DIAGNOSIS — E785 Hyperlipidemia, unspecified: Secondary | ICD-10-CM | POA: Diagnosis not present

## 2022-12-09 DIAGNOSIS — B351 Tinea unguium: Secondary | ICD-10-CM | POA: Diagnosis not present

## 2022-12-10 DIAGNOSIS — D1801 Hemangioma of skin and subcutaneous tissue: Secondary | ICD-10-CM | POA: Diagnosis not present

## 2022-12-10 DIAGNOSIS — L814 Other melanin hyperpigmentation: Secondary | ICD-10-CM | POA: Diagnosis not present

## 2022-12-10 DIAGNOSIS — B351 Tinea unguium: Secondary | ICD-10-CM | POA: Diagnosis not present

## 2022-12-10 DIAGNOSIS — L821 Other seborrheic keratosis: Secondary | ICD-10-CM | POA: Diagnosis not present

## 2022-12-10 DIAGNOSIS — Z85828 Personal history of other malignant neoplasm of skin: Secondary | ICD-10-CM | POA: Diagnosis not present

## 2022-12-11 ENCOUNTER — Ambulatory Visit: Payer: Medicare Other | Attending: Family Medicine | Admitting: Physical Therapy

## 2022-12-11 DIAGNOSIS — M6281 Muscle weakness (generalized): Secondary | ICD-10-CM | POA: Diagnosis not present

## 2022-12-11 DIAGNOSIS — R279 Unspecified lack of coordination: Secondary | ICD-10-CM | POA: Diagnosis not present

## 2022-12-11 DIAGNOSIS — R293 Abnormal posture: Secondary | ICD-10-CM | POA: Insufficient documentation

## 2022-12-11 NOTE — Therapy (Signed)
OUTPATIENT PHYSICAL THERAPY FEMALE PELVIC TREATMENT   Patient Name: Brittany Gutierrez MRN: [id number] DOB:1956-04-06, 67 y.o., female Today's Date: 12/11/2022  END OF SESSION:  PT End of Session - 12/11/22 0847     Visit Number 3    Date for PT Re-Evaluation 02/05/23    Authorization Type medicare    Progress Note Due on Visit 10    PT Start Time 0847    PT Stop Time 0928    PT Time Calculation (min) 41 min    Activity Tolerance Patient tolerated treatment well    Behavior During Therapy [facility] for tasks assessed/performed             Past Medical History:  Diagnosis Date   Allergy    seasonal allergies   RA (rheumatoid arthritis) (HCC)    juvenile   Past Surgical History:  Procedure Laterality Date   ANKLE FUSION  1997   left   INNER EAR SURGERY  1979   SYNOVECTOMY FOOT     left   Patient Active Problem List   Diagnosis Date Noted   Plantar fasciitis 01/18/2019   Pain in limb 01/01/2012   Varicose veins of lower extremities with other complications 01/01/2012    PCP: Catha Gosselin, MD  REFERRING PROVIDER: Jarrett Soho, PA-C    REFERRING DIAG: N39.3 (ICD-10-CM) - Stress incontinence of urine  THERAPY DIAG:  Muscle weakness (generalized)  Abnormal posture  Unspecified lack of coordination  Rationale for Evaluation and Treatment: Rehabilitation  ONSET DATE: over a year  SUBJECTIVE:                                                                                                                                                                                           SUBJECTIVE STATEMENT: Has been doing exercises regularly and has noticed and improvement of leakage now.  Pt reports less urgency and frequency overall, has had sneezes without leakage now too.   Decreased fluids attempting to no have to go the bathroom frequently   Fluid intake: Yes: water - 1-1.5 bottles per day, "a lot of hot tea", multiple seltzer waters, coffee in am     PAIN:  Are  you having pain? No   PRECAUTIONS: None  WEIGHT BEARING RESTRICTIONS: No  FALLS:  Has patient fallen in last 6 months? No  LIVING ENVIRONMENT: Lives with: lives with their family Lives in: House/apartment  OCCUPATION: preschool teacher   PLOF: Independent  PATIENT GOALS: to have less leakage  PERTINENT HISTORY:  Lt ankle fusion, RA,  Sexual abuse: No  BOWEL MOVEMENT: Pain with bowel movement: No Type of bowel movement:Type (Bristol Stool  Scale) 4, Frequency daily, and Strain Yes Fully empty rectum: Yes:   Leakage: No Pads: No Fiber supplement: No  URINATION: Pain with urination: No Fully empty bladder: Yes: sometimes strains  Stream: Strong Urgency: Yes:   Frequency: not quicker than every 2 hours, 1x night Leakage: Urge to void, Coughing, Sneezing, Laughing, and Exercise Pads: Yes: all the time, sometimes would change a pad to have a new one but not due to leakage  INTERCOURSE: Pain with intercourse:  not painful Ability to have vaginal penetration:  Yes:   Climax: not painful Marinoff Scale: 0/3  PREGNANCY: Vaginal deliveries 3 Tearing Yes: episiotomy with at least first  C-section deliveries 0 Currently pregnant No  PROLAPSE: None   OBJECTIVE:   DIAGNOSTIC FINDINGS:    COGNITION: Overall cognitive status: Within functional limits for tasks assessed     SENSATION: Light touch: Appears intact Proprioception: Appears intact  MUSCLE LENGTH: Bil hamstrings and adductors limited by 25%   POSTURE: rounded shoulders, forward head, and posterior pelvic tilt  PELVIC ALIGNMENT: WFL  LUMBARAROM/PROM:  A/PROM A/PROM  eval  Flexion Limited by 25%  Extension WFL  Right lateral flexion Limited by 25%  Left lateral flexion Limited by 25%  Right rotation Limited by 25%  Left rotation Limited by 25%   (Blank rows = not tested)  LOWER EXTREMITY ROM:  WFL  LOWER EXTREMITY MMT:  Bil hip abduction 3/5, hip flexion 3+/5, extension 3+/5,  adduction 3+/5; knees 5/5 PALPATION:   General  no TTP                External Perineal Exam mild dryness noted, no TTP                             Internal Pelvic Floor no TTP  Patient confirms identification and approves PT to assess internal pelvic floor and treatment Yes  PELVIC MMT:   MMT eval  Vaginal 2/5, 2s, 4 reps - cues for technique, great difficulty with not bearing down   Internal Anal Sphincter   External Anal Sphincter   Puborectalis   Diastasis Recti   (Blank rows = not tested)        TONE: Decreased   PROLAPSE: Possible grade two anterior vaginal wall laxity in hooklying with cough  TODAY'S TREATMENT:                                                                                                                              DATE:   11/19/22: Patient consented to internal pelvic floor assessment vaginally this date and found to have decreased strength, endurance, and coordination. Patient benefited from verbal cues for improved technique with pelvic floor contractions and breathing coordination. Pt had difficulty with not bearing down, multiple reps and cues to improve with technique with intermittent improvement. After this, pt dressed with privacy and PT returned to room. Pt educated on findings. Pt  also directed in x10 pelvic floor contractions with sitting on towel roll and pt reports she feels this much easier than lying down or sitting normally. Pt given HEP and reviewed as well.   12/11/22: Therapeutic activity: Urge drill given and reviewed  NMRE: all exercises cued for coordination breathing mechanics and pelvic floor activation  Bridges 2x10 Ball squeezes 2x10 Opp hand/knee ball press 2x10 Sidelying hip abduction with ball press 2x10 2x10 Sit to stand 5# Modified jacks x10      PATIENT EDUCATION:  Education details: bladder irritants, urge drill, knack, PJTNVCKW Person educated: Patient Education method: Explanation, Demonstration, Tactile  cues, and Verbal cues Education comprehension: verbalized understanding and returned demonstration  HOME EXERCISE PROGRAM: PJTNVCKW  ASSESSMENT:  CLINICAL IMPRESSION: Patient presents for treatment, focus on coordination of breathing and pelvic floor with exercises to improve strengthening of pelvic floor and also activation of pelvic floor with functional activity to decreased leakage. Pt tolerated well denied leakage, benefited from cues for coordination and technique but able to correct and complete well. Pt reports she has better feeling of pelvic floor contraction now and has noted improvement with leakage and urgency. HEP updated today as well, reviewed with pt and denied questions. Pt would benefit from additional PT to further address deficits.     OBJECTIVE IMPAIRMENTS: decreased coordination, decreased endurance, decreased mobility, decreased strength, impaired flexibility, improper body mechanics, and postural dysfunction.   ACTIVITY LIMITATIONS: carrying, lifting, squatting, and continence  PARTICIPATION LIMITATIONS: community activity and occupation  PERSONAL FACTORS: Time since onset of injury/illness/exacerbation and 1 comorbidity: x3 vaginal births with at least one episiotomy  are also affecting patient's functional outcome.   REHAB POTENTIAL: Good  CLINICAL DECISION MAKING: Stable/uncomplicated  EVALUATION COMPLEXITY: Low   GOALS: Goals reviewed with patient? Yes  SHORT TERM GOALS: Target date: 12/03/22  Pt to be I with HEP.  Baseline: Goal status: INITIAL  2.  Pt will have 25% less urgency due to bladder retraining and strengthening  Baseline:  Goal status: INITIAL  3.  Pt to be I with knack with stressors to decrease urine leakage.  Baseline:  Goal status: INITIAL  4.  Pt to be I with urge drill for improved leakage with urgency. Baseline:  Goal status: INITIAL   LONG TERM GOALS: Target date: 02/05/23  Pt to be I with advanced HEP.  Baseline:  Goal  status: INITIAL  2.  Pt will have 50% less urgency due to bladder retraining and strengthening  Baseline:  Goal status: INITIAL  3.  Pt to report improved time between bladder voids to at least 2-3 hours for improved QOL with decreased urinary frequency and voiding to limit over stretching of bladder.   Baseline:  Baseline:  Goal status: INITIAL  4.  Pt to demonstrate at least 4/5 pelvic floor strength for improved pelvic stability and decreased strain at pelvic floor/ decrease leakage.  Baseline:  Goal status: INITIAL  5.  Pt to demonstrate improved coordination of pelvic floor and breathing mechanics with 20# squat with appropriate synergistic patterns to decrease pain and leakage at least 75% of the time.    Baseline:  Goal status: INITIAL  PLAN:  PT FREQUENCY: 1x/week  PT DURATION:  8 sessions  PLANNED INTERVENTIONS: Therapeutic exercises, Therapeutic activity, Neuromuscular re-education, Patient/Family education, Self Care, Joint mobilization, Dry Needling, Spinal mobilization, Cryotherapy, Moist heat, scar mobilization, Taping, Biofeedback, and Manual therapy  PLAN FOR NEXT SESSION: internal if needed and pt consents, strengthening core/hips/pelvic floor, mobility in spine and hips,  coordination of pelvic floor and breathing with activity   Otelia Sergeant, PT, DPT 07/10/249:31 AM

## 2022-12-11 NOTE — Patient Instructions (Signed)

## 2022-12-13 DIAGNOSIS — K648 Other hemorrhoids: Secondary | ICD-10-CM | POA: Diagnosis not present

## 2022-12-13 DIAGNOSIS — Z1211 Encounter for screening for malignant neoplasm of colon: Secondary | ICD-10-CM | POA: Diagnosis not present

## 2022-12-19 ENCOUNTER — Ambulatory Visit: Payer: Medicare Other | Admitting: Physical Therapy

## 2022-12-19 DIAGNOSIS — M6281 Muscle weakness (generalized): Secondary | ICD-10-CM

## 2022-12-19 DIAGNOSIS — R293 Abnormal posture: Secondary | ICD-10-CM | POA: Diagnosis not present

## 2022-12-19 DIAGNOSIS — R279 Unspecified lack of coordination: Secondary | ICD-10-CM | POA: Diagnosis not present

## 2022-12-19 NOTE — Therapy (Signed)
OUTPATIENT PHYSICAL THERAPY FEMALE PELVIC TREATMENT   Patient Name: Brittany Gutierrez MRN: [id number] DOB:06/26/1955, 67 y.o., female Today's Date: 12/19/2022  END OF SESSION:  PT End of Session - 12/19/22 1102     Visit Number 4    Date for PT Re-Evaluation 02/05/23    Authorization Type medicare    Progress Note Due on Visit 10    PT Start Time 1100    PT Stop Time 1142    PT Time Calculation (min) 42 min    Activity Tolerance Patient tolerated treatment well    Behavior During Therapy WFL for tasks assessed/performed             Past Medical History:  Diagnosis Date   Allergy    seasonal allergies   RA (rheumatoid arthritis) (HCC)    juvenile   Past Surgical History:  Procedure Laterality Date   ANKLE FUSION  1997   left   INNER EAR SURGERY  1979   SYNOVECTOMY FOOT     left   Patient Active Problem List   Diagnosis Date Noted   Plantar fasciitis 01/18/2019   Pain in limb 01/01/2012   Varicose veins of lower extremities with other complications 01/01/2012    PCP: Catha Gosselin, MD  REFERRING PROVIDER: Jarrett Soho, PA-C    REFERRING DIAG: N39.3 (ICD-10-CM) - Stress incontinence of urine  THERAPY DIAG:  Muscle weakness (generalized)  Abnormal posture  Unspecified lack of coordination  Rationale for Evaluation and Treatment: Rehabilitation  ONSET DATE: over a year  SUBJECTIVE:                                                                                                                                                                                           SUBJECTIVE STATEMENT: Has been doing exercises regularly and has noticed and improvement of leakage now.  Pt reports less urgency and frequency overall, has had sneezes without leakage now too.   Decreased fluids attempting to no have to go the bathroom frequently   Fluid intake: Yes: water - 1-1.5 bottles per day, "a lot of hot tea", multiple seltzer waters, coffee in am     PAIN:  Are  you having pain? No   PRECAUTIONS: None  WEIGHT BEARING RESTRICTIONS: No  FALLS:  Has patient fallen in last 6 months? No  LIVING ENVIRONMENT: Lives with: lives with their family Lives in: House/apartment  OCCUPATION: preschool teacher   PLOF: Independent  PATIENT GOALS: to have less leakage  PERTINENT HISTORY:  Lt ankle fusion, RA,  Sexual abuse: No  BOWEL MOVEMENT: Pain with bowel movement: No Type of bowel movement:Type (Bristol Stool  Scale) 4, Frequency daily, and Strain Yes Fully empty rectum: Yes:   Leakage: No Pads: No Fiber supplement: No  URINATION: Pain with urination: No Fully empty bladder: Yes: sometimes strains  Stream: Strong Urgency: Yes:   Frequency: not quicker than every 2 hours, 1x night Leakage: Urge to void, Coughing, Sneezing, Laughing, and Exercise Pads: Yes: all the time, sometimes would change a pad to have a new one but not due to leakage  INTERCOURSE: Pain with intercourse:  not painful Ability to have vaginal penetration:  Yes:   Climax: not painful Marinoff Scale: 0/3  PREGNANCY: Vaginal deliveries 3 Tearing Yes: episiotomy with at least first  C-section deliveries 0 Currently pregnant No  PROLAPSE: None   OBJECTIVE:   DIAGNOSTIC FINDINGS:    COGNITION: Overall cognitive status: Within functional limits for tasks assessed     SENSATION: Light touch: Appears intact Proprioception: Appears intact  MUSCLE LENGTH: Bil hamstrings and adductors limited by 25%   POSTURE: rounded shoulders, forward head, and posterior pelvic tilt  PELVIC ALIGNMENT: WFL  LUMBARAROM/PROM:  A/PROM A/PROM  eval  Flexion Limited by 25%  Extension WFL  Right lateral flexion Limited by 25%  Left lateral flexion Limited by 25%  Right rotation Limited by 25%  Left rotation Limited by 25%   (Blank rows = not tested)  LOWER EXTREMITY ROM:  WFL  LOWER EXTREMITY MMT:  Bil hip abduction 3/5, hip flexion 3+/5, extension 3+/5,  adduction 3+/5; knees 5/5 PALPATION:   General  no TTP                External Perineal Exam mild dryness noted, no TTP                             Internal Pelvic Floor no TTP  Patient confirms identification and approves PT to assess internal pelvic floor and treatment Yes  PELVIC MMT:   MMT eval  Vaginal 2/5, 2s, 4 reps - cues for technique, great difficulty with not bearing down   Internal Anal Sphincter   External Anal Sphincter   Puborectalis   Diastasis Recti   (Blank rows = not tested)        TONE: Decreased   PROLAPSE: Possible grade two anterior vaginal wall laxity in hooklying with cough  TODAY'S TREATMENT:                                                                                                                              DATE:   12/19/22:  Therapeutic activity: Urge drill given and reviewed  NMRE: all exercises cued for coordination breathing mechanics and pelvic floor activation  Bridges 2x10 Bird dogs 2x10 2x10 Sit to stand with ball squeeze 2x10 Sit to stand 10# Lunges with 3# row x10 each Farmer's carry 10# 750' in Rt hand and 750' in Lt hand Mario punches 5# x10 each Modified jacks x10   PATIENT  EDUCATION:  Education details: bladder irritants, urge drill, knack, PJTNVCKW Person educated: Patient Education method: Explanation, Demonstration, Tactile cues, and Verbal cues Education comprehension: verbalized understanding and returned demonstration  HOME EXERCISE PROGRAM: PJTNVCKW  ASSESSMENT:  CLINICAL IMPRESSION: Patient presents for treatment, focus on coordination of breathing and pelvic floor with exercises to improve strengthening of pelvic floor and also activation of pelvic floor with functional activity to decreased leakage. Pt tolerated well denied leakage, benefited from cues for coordination and technique but able to correct and complete well. Pt reports she has better feeling of pelvic floor contraction now and has noted  improvement with leakage and urgency. Pt session progressed with more standing and higher weighted resistance today, good tolerance. Pt would benefit from additional PT to further address deficits.     OBJECTIVE IMPAIRMENTS: decreased coordination, decreased endurance, decreased mobility, decreased strength, impaired flexibility, improper body mechanics, and postural dysfunction.   ACTIVITY LIMITATIONS: carrying, lifting, squatting, and continence  PARTICIPATION LIMITATIONS: community activity and occupation  PERSONAL FACTORS: Time since onset of injury/illness/exacerbation and 1 comorbidity: x3 vaginal births with at least one episiotomy  are also affecting patient's functional outcome.   REHAB POTENTIAL: Good  CLINICAL DECISION MAKING: Stable/uncomplicated  EVALUATION COMPLEXITY: Low   GOALS: Goals reviewed with patient? Yes  SHORT TERM GOALS: Target date: 12/03/22  Pt to be I with HEP.  Baseline: Goal status: INITIAL  2.  Pt will have 25% less urgency due to bladder retraining and strengthening  Baseline:  Goal status: INITIAL  3.  Pt to be I with knack with stressors to decrease urine leakage.  Baseline:  Goal status: INITIAL  4.  Pt to be I with urge drill for improved leakage with urgency. Baseline:  Goal status: INITIAL   LONG TERM GOALS: Target date: 02/05/23  Pt to be I with advanced HEP.  Baseline:  Goal status: INITIAL  2.  Pt will have 50% less urgency due to bladder retraining and strengthening  Baseline:  Goal status: INITIAL  3.  Pt to report improved time between bladder voids to at least 2-3 hours for improved QOL with decreased urinary frequency and voiding to limit over stretching of bladder.   Baseline:  Baseline:  Goal status: INITIAL  4.  Pt to demonstrate at least 4/5 pelvic floor strength for improved pelvic stability and decreased strain at pelvic floor/ decrease leakage.  Baseline:  Goal status: INITIAL  5.  Pt to demonstrate improved  coordination of pelvic floor and breathing mechanics with 20# squat with appropriate synergistic patterns to decrease pain and leakage at least 75% of the time.    Baseline:  Goal status: INITIAL  PLAN:  PT FREQUENCY: 1x/week  PT DURATION:  8 sessions  PLANNED INTERVENTIONS: Therapeutic exercises, Therapeutic activity, Neuromuscular re-education, Patient/Family education, Self Care, Joint mobilization, Dry Needling, Spinal mobilization, Cryotherapy, Moist heat, scar mobilization, Taping, Biofeedback, and Manual therapy  PLAN FOR NEXT SESSION: internal if needed and pt consents, strengthening core/hips/pelvic floor, mobility in spine and hips, coordination of pelvic floor and breathing with activity   Otelia Sergeant, PT, DPT 12/19/2410:13 PM

## 2022-12-26 ENCOUNTER — Ambulatory Visit: Payer: Medicare Other | Admitting: Physical Therapy

## 2022-12-26 DIAGNOSIS — R293 Abnormal posture: Secondary | ICD-10-CM

## 2022-12-26 DIAGNOSIS — M6281 Muscle weakness (generalized): Secondary | ICD-10-CM | POA: Diagnosis not present

## 2022-12-26 DIAGNOSIS — R279 Unspecified lack of coordination: Secondary | ICD-10-CM | POA: Diagnosis not present

## 2022-12-26 NOTE — Therapy (Signed)
OUTPATIENT PHYSICAL THERAPY FEMALE PELVIC TREATMENT   Patient Name: Brittany Gutierrez MRN: [id number] DOB:14-Mar-1956, 67 y.o., female Today's Date: 12/26/2022  END OF SESSION:  PT End of Session - 12/26/22 1106     Visit Number 5    Date for PT Re-Evaluation 02/05/23    Authorization Type medicare    Progress Note Due on Visit 10    PT Start Time 1104    PT Stop Time 1144    PT Time Calculation (min) 40 min    Activity Tolerance Patient tolerated treatment well    Behavior During Therapy WFL for tasks assessed/performed              Past Medical History:  Diagnosis Date   Allergy    seasonal allergies   RA (rheumatoid arthritis) (HCC)    juvenile   Past Surgical History:  Procedure Laterality Date   ANKLE FUSION  1997   left   INNER EAR SURGERY  1979   SYNOVECTOMY FOOT     left   Patient Active Problem List   Diagnosis Date Noted   Plantar fasciitis 01/18/2019   Pain in limb 01/01/2012   Varicose veins of lower extremities with other complications 01/01/2012    PCP: Catha Gosselin, MD  REFERRING PROVIDER: Jarrett Soho, PA-C    REFERRING DIAG: N39.3 (ICD-10-CM) - Stress incontinence of urine  THERAPY DIAG:  Muscle weakness (generalized)  Abnormal posture  Unspecified lack of coordination  Rationale for Evaluation and Treatment: Rehabilitation  ONSET DATE: over a year  SUBJECTIVE:                                                                                                                                                                                           SUBJECTIVE STATEMENT: Pt reports she has decreased  the size pads now and having less leakage overall with smaller amounts and less often. Urge drill is helpful and having less leakage (less often) and only with full bladder now.   Decreased fluids attempting to no have to go the bathroom frequently   Fluid intake: Yes: water - 1-1.5 bottles per day, "a lot of hot tea", multiple seltzer  waters, coffee in am     PAIN:  Are you having pain? No   PRECAUTIONS: None  WEIGHT BEARING RESTRICTIONS: No  FALLS:  Has patient fallen in last 6 months? No  LIVING ENVIRONMENT: Lives with: lives with their family Lives in: House/apartment  OCCUPATION: preschool teacher   PLOF: Independent  PATIENT GOALS: to have less leakage  PERTINENT HISTORY:  Lt ankle fusion, RA,  Sexual abuse: No  BOWEL MOVEMENT: Pain  with bowel movement: No Type of bowel movement:Type (Bristol Stool Scale) 4, Frequency daily, and Strain Yes Fully empty rectum: Yes:   Leakage: No Pads: No Fiber supplement: No  URINATION: Pain with urination: No Fully empty bladder: Yes: sometimes strains  Stream: Strong Urgency: Yes:   Frequency: not quicker than every 2 hours, 1x night Leakage: Urge to void, Coughing, Sneezing, Laughing, and Exercise Pads: Yes: all the time, sometimes would change a pad to have a new one but not due to leakage  INTERCOURSE: Pain with intercourse:  not painful Ability to have vaginal penetration:  Yes:   Climax: not painful Marinoff Scale: 0/3  PREGNANCY: Vaginal deliveries 3 Tearing Yes: episiotomy with at least first  C-section deliveries 0 Currently pregnant No  PROLAPSE: None   OBJECTIVE:   DIAGNOSTIC FINDINGS:    COGNITION: Overall cognitive status: Within functional limits for tasks assessed     SENSATION: Light touch: Appears intact Proprioception: Appears intact  MUSCLE LENGTH: Bil hamstrings and adductors limited by 25%   POSTURE: rounded shoulders, forward head, and posterior pelvic tilt  PELVIC ALIGNMENT: WFL  LUMBARAROM/PROM:  A/PROM A/PROM  eval  Flexion Limited by 25%  Extension WFL  Right lateral flexion Limited by 25%  Left lateral flexion Limited by 25%  Right rotation Limited by 25%  Left rotation Limited by 25%   (Blank rows = not tested)  LOWER EXTREMITY ROM:  WFL  LOWER EXTREMITY MMT:  Bil hip abduction 3/5,  hip flexion 3+/5, extension 3+/5, adduction 3+/5; knees 5/5 PALPATION:   General  no TTP                External Perineal Exam mild dryness noted, no TTP                             Internal Pelvic Floor no TTP  Patient confirms identification and approves PT to assess internal pelvic floor and treatment Yes  PELVIC MMT:   MMT eval  Vaginal 2/5, 2s, 4 reps - cues for technique, great difficulty with not bearing down   Internal Anal Sphincter   External Anal Sphincter   Puborectalis   Diastasis Recti   (Blank rows = not tested)        TONE: Decreased   PROLAPSE: Possible grade two anterior vaginal wall laxity in hooklying with cough  TODAY'S TREATMENT:                                                                                                                              DATE:   12/26/22:  NMRE: all exercises cued for coordination breathing mechanics and pelvic floor activation  Bird dogs 2x10 2x10 Sit to stand 10# Squats 2x10 10# Palloff cables 5# Farmer's carry 15# 1000' in Rt hand and 1000' in Lt hand Mario punches 5# x10 each alt  Rt/lt weight shifts x10 quicker pace Front/back weight shifts x10 quicker  pace   PATIENT EDUCATION:  Education details: bladder irritants, urge drill, knack, PJTNVCKW Person educated: Patient Education method: Explanation, Demonstration, Tactile cues, and Verbal cues Education comprehension: verbalized understanding and returned demonstration  HOME EXERCISE PROGRAM: PJTNVCKW  ASSESSMENT:  CLINICAL IMPRESSION: Patient presents for treatment, focus on coordination of breathing and pelvic floor with exercises to improve strengthening of pelvic floor and also activation of pelvic floor with functional activity to decreased leakage. Pt tolerated well denied leakage, benefited from cues for coordination and technique but able to correct and complete well. Pt session progressed with more standing and higher weighted resistance today and  started quicker paced exercises today, good tolerance. Pt would benefit from additional PT to further address deficits.     OBJECTIVE IMPAIRMENTS: decreased coordination, decreased endurance, decreased mobility, decreased strength, impaired flexibility, improper body mechanics, and postural dysfunction.   ACTIVITY LIMITATIONS: carrying, lifting, squatting, and continence  PARTICIPATION LIMITATIONS: community activity and occupation  PERSONAL FACTORS: Time since onset of injury/illness/exacerbation and 1 comorbidity: x3 vaginal births with at least one episiotomy  are also affecting patient's functional outcome.   REHAB POTENTIAL: Good  CLINICAL DECISION MAKING: Stable/uncomplicated  EVALUATION COMPLEXITY: Low   GOALS: Goals reviewed with patient? Yes  SHORT TERM GOALS: Target date: 12/03/22  Pt to be I with HEP.  Baseline: Goal status: INITIAL  2.  Pt will have 25% less urgency due to bladder retraining and strengthening  Baseline:  Goal status: INITIAL  3.  Pt to be I with knack with stressors to decrease urine leakage.  Baseline:  Goal status: INITIAL  4.  Pt to be I with urge drill for improved leakage with urgency. Baseline:  Goal status: INITIAL   LONG TERM GOALS: Target date: 02/05/23  Pt to be I with advanced HEP.  Baseline:  Goal status: INITIAL  2.  Pt will have 50% less urgency due to bladder retraining and strengthening  Baseline:  Goal status: INITIAL  3.  Pt to report improved time between bladder voids to at least 2-3 hours for improved QOL with decreased urinary frequency and voiding to limit over stretching of bladder.   Baseline:  Baseline:  Goal status: INITIAL  4.  Pt to demonstrate at least 4/5 pelvic floor strength for improved pelvic stability and decreased strain at pelvic floor/ decrease leakage.  Baseline:  Goal status: INITIAL  5.  Pt to demonstrate improved coordination of pelvic floor and breathing mechanics with 20# squat with  appropriate synergistic patterns to decrease pain and leakage at least 75% of the time.    Baseline:  Goal status: INITIAL  PLAN:  PT FREQUENCY: 1x/week  PT DURATION:  8 sessions  PLANNED INTERVENTIONS: Therapeutic exercises, Therapeutic activity, Neuromuscular re-education, Patient/Family education, Self Care, Joint mobilization, Dry Needling, Spinal mobilization, Cryotherapy, Moist heat, scar mobilization, Taping, Biofeedback, and Manual therapy  PLAN FOR NEXT SESSION: internal if needed and pt consents, strengthening core/hips/pelvic floor, mobility in spine and hips, coordination of pelvic floor and breathing with activity   Otelia Sergeant, PT, DPT 12/25/2409:54 AM

## 2022-12-31 ENCOUNTER — Ambulatory Visit: Payer: Medicare Other | Admitting: Physical Therapy

## 2022-12-31 DIAGNOSIS — R293 Abnormal posture: Secondary | ICD-10-CM

## 2022-12-31 DIAGNOSIS — M6281 Muscle weakness (generalized): Secondary | ICD-10-CM | POA: Diagnosis not present

## 2022-12-31 DIAGNOSIS — R279 Unspecified lack of coordination: Secondary | ICD-10-CM | POA: Diagnosis not present

## 2022-12-31 NOTE — Therapy (Signed)
OUTPATIENT PHYSICAL THERAPY FEMALE PELVIC TREATMENT   Patient Name: Brittany Gutierrez MRN: [id number] DOB:1955/10/30, 67 y.o., female Today's Date: 12/31/2022  END OF SESSION:  PT End of Session - 12/31/22 1617     Visit Number 6    Date for PT Re-Evaluation 02/05/23    Authorization Type medicare    Progress Note Due on Visit 10    PT Start Time 1616    PT Stop Time 1652    PT Time Calculation (min) 36 min    Activity Tolerance Patient tolerated treatment well    Behavior During Therapy WFL for tasks assessed/performed              Past Medical History:  Diagnosis Date   Allergy    seasonal allergies   RA (rheumatoid arthritis) (HCC)    juvenile   Past Surgical History:  Procedure Laterality Date   ANKLE FUSION  1997   left   INNER EAR SURGERY  1979   SYNOVECTOMY FOOT     left   Patient Active Problem List   Diagnosis Date Noted   Plantar fasciitis 01/18/2019   Pain in limb 01/01/2012   Varicose veins of lower extremities with other complications 01/01/2012    PCP: Catha Gosselin, MD  REFERRING PROVIDER: Jarrett Soho, PA-C    REFERRING DIAG: N39.3 (ICD-10-CM) - Stress incontinence of urine  THERAPY DIAG:  Muscle weakness (generalized)  Abnormal posture  Unspecified lack of coordination  Rationale for Evaluation and Treatment: Rehabilitation  ONSET DATE: over a year  SUBJECTIVE:                                                                                                                                                                                           SUBJECTIVE STATEMENT: Pt reports all recommendations have been helping a lot. They have made me feel more confident.   Decreased fluids attempting to no have to go the bathroom frequently   Fluid intake: Yes: water - 1-1.5 bottles per day, "a lot of hot tea", multiple seltzer waters, coffee in am     PAIN:  Are you having pain? No   PRECAUTIONS: None  WEIGHT BEARING RESTRICTIONS:  No  FALLS:  Has patient fallen in last 6 months? No  LIVING ENVIRONMENT: Lives with: lives with their family Lives in: House/apartment  OCCUPATION: preschool teacher   PLOF: Independent  PATIENT GOALS: to have less leakage  PERTINENT HISTORY:  Lt ankle fusion, RA,  Sexual abuse: No  BOWEL MOVEMENT: Pain with bowel movement: No Type of bowel movement:Type (Bristol Stool Scale) 4, Frequency daily, and Strain Yes Fully empty rectum: Yes:  Leakage: No Pads: No Fiber supplement: No  URINATION: Pain with urination: No Fully empty bladder: Yes: sometimes strains  Stream: Strong Urgency: Yes:   Frequency: not quicker than every 2 hours, 1x night Leakage: Urge to void, Coughing, Sneezing, Laughing, and Exercise Pads: Yes: all the time, sometimes would change a pad to have a new one but not due to leakage  INTERCOURSE: Pain with intercourse:  not painful Ability to have vaginal penetration:  Yes:   Climax: not painful Marinoff Scale: 0/3  PREGNANCY: Vaginal deliveries 3 Tearing Yes: episiotomy with at least first  C-section deliveries 0 Currently pregnant No  PROLAPSE: None   OBJECTIVE:   DIAGNOSTIC FINDINGS:    COGNITION: Overall cognitive status: Within functional limits for tasks assessed     SENSATION: Light touch: Appears intact Proprioception: Appears intact  MUSCLE LENGTH: Bil hamstrings and adductors limited by 25%   POSTURE: rounded shoulders, forward head, and posterior pelvic tilt  PELVIC ALIGNMENT: WFL  LUMBARAROM/PROM:  A/PROM A/PROM  eval  Flexion Limited by 25%  Extension WFL  Right lateral flexion Limited by 25%  Left lateral flexion Limited by 25%  Right rotation Limited by 25%  Left rotation Limited by 25%   (Blank rows = not tested)  LOWER EXTREMITY ROM:  WFL  LOWER EXTREMITY MMT:  Bil hip abduction 3/5, hip flexion 3+/5, extension 3+/5, adduction 3+/5; knees 5/5 PALPATION:   General  no TTP                 External Perineal Exam mild dryness noted, no TTP                             Internal Pelvic Floor no TTP  Patient confirms identification and approves PT to assess internal pelvic floor and treatment Yes  PELVIC MMT:   MMT eval  Vaginal 2/5, 2s, 4 reps - cues for technique, great difficulty with not bearing down   Internal Anal Sphincter   External Anal Sphincter   Puborectalis   Diastasis Recti   (Blank rows = not tested)        TONE: Decreased   PROLAPSE: Possible grade two anterior vaginal wall laxity in hooklying with cough  TODAY'S TREATMENT:                                                                                                                              DATE:   12/31/22  NMRE: all exercises cued for coordination breathing mechanics and pelvic floor activation  Bird dogs 2x10 Fire hydrant red loop 2x10 Donkey kicks red loop 2x10 2x10 Sit to stand 10# Squats 2x10 10# 6# cross body crunches x10  Farmer's carry 15#+6# 1000'  Rt/lt weight shifts x10 quicker pace Front/back weight shifts x10 quicker pace   PATIENT EDUCATION:  Education details: bladder irritants, urge drill, knack, PJTNVCKW Person educated: Patient Education method: Explanation, Demonstration, Tactile cues, and  Verbal cues Education comprehension: verbalized understanding and returned demonstration  HOME EXERCISE PROGRAM: PJTNVCKW  ASSESSMENT:  CLINICAL IMPRESSION: Patient presents for treatment, focus on coordination of breathing and pelvic floor with exercises to improve strengthening of pelvic floor and also activation of pelvic floor with functional activity to decreased leakage. Pt tolerated well denied leakage, benefited from cues for coordination and technique but able to correct and complete well.  Pt would benefit from additional PT to further address deficits.     OBJECTIVE IMPAIRMENTS: decreased coordination, decreased endurance, decreased mobility, decreased strength,  impaired flexibility, improper body mechanics, and postural dysfunction.   ACTIVITY LIMITATIONS: carrying, lifting, squatting, and continence  PARTICIPATION LIMITATIONS: community activity and occupation  PERSONAL FACTORS: Time since onset of injury/illness/exacerbation and 1 comorbidity: x3 vaginal births with at least one episiotomy  are also affecting patient's functional outcome.   REHAB POTENTIAL: Good  CLINICAL DECISION MAKING: Stable/uncomplicated  EVALUATION COMPLEXITY: Low   GOALS: Goals reviewed with patient? Yes  SHORT TERM GOALS: Target date: 12/03/22  Pt to be I with HEP.  Baseline: Goal status: MET  2.  Pt will have 25% less urgency due to bladder retraining and strengthening  Baseline:  Goal status: MET  3.  Pt to be I with knack with stressors to decrease urine leakage.  Baseline:  Goal status: MET  4.  Pt to be I with urge drill for improved leakage with urgency. Baseline:  Goal status: MET   LONG TERM GOALS: Target date: 02/05/23  Pt to be I with advanced HEP.  Baseline:  Goal status: on going  2.  Pt will have 50% less urgency due to bladder retraining and strengthening  Baseline:  Goal status:  on going  3.  Pt to report improved time between bladder voids to at least 2-3 hours for improved QOL with decreased urinary frequency and voiding to limit over stretching of bladder.   Baseline:  Baseline:  Goal status:  on going  4.  Pt to demonstrate at least 4/5 pelvic floor strength for improved pelvic stability and decreased strain at pelvic floor/ decrease leakage.  Baseline:  Goal status:  on going  5.  Pt to demonstrate improved coordination of pelvic floor and breathing mechanics with 20# squat with appropriate synergistic patterns to decrease pain and leakage at least 75% of the time.    Baseline:  Goal status:  on going  PLAN:  PT FREQUENCY: 1x/week  PT DURATION:  8 sessions  PLANNED INTERVENTIONS: Therapeutic exercises, Therapeutic  activity, Neuromuscular re-education, Patient/Family education, Self Care, Joint mobilization, Dry Needling, Spinal mobilization, Cryotherapy, Moist heat, scar mobilization, Taping, Biofeedback, and Manual therapy  PLAN FOR NEXT SESSION: internal if needed and pt consents, strengthening core/hips/pelvic floor, mobility in spine and hips, coordination of pelvic floor and breathing with activity   Otelia Sergeant, PT, DPT 07/30/244:58 PM

## 2023-01-01 ENCOUNTER — Encounter: Payer: Medicare Other | Admitting: Physical Therapy

## 2023-01-15 ENCOUNTER — Encounter: Payer: Medicare Other | Admitting: Physical Therapy

## 2023-01-20 DIAGNOSIS — Z6822 Body mass index (BMI) 22.0-22.9, adult: Secondary | ICD-10-CM | POA: Diagnosis not present

## 2023-01-20 DIAGNOSIS — J069 Acute upper respiratory infection, unspecified: Secondary | ICD-10-CM | POA: Diagnosis not present

## 2023-01-29 ENCOUNTER — Ambulatory Visit: Payer: Medicare Other | Attending: Family Medicine | Admitting: Physical Therapy

## 2023-01-29 DIAGNOSIS — R279 Unspecified lack of coordination: Secondary | ICD-10-CM | POA: Insufficient documentation

## 2023-01-29 DIAGNOSIS — M6281 Muscle weakness (generalized): Secondary | ICD-10-CM | POA: Diagnosis not present

## 2023-01-29 DIAGNOSIS — R293 Abnormal posture: Secondary | ICD-10-CM | POA: Diagnosis not present

## 2023-01-29 NOTE — Therapy (Signed)
OUTPATIENT PHYSICAL THERAPY FEMALE PELVIC TREATMENT   Patient Name: Brittany Gutierrez MRN: [id number] DOB:[birth year], 67 y.o., female Today's Date: 01/29/2023  END OF SESSION:  PT End of Session - 01/29/23 0935     Visit Number 7    Date for PT Re-Evaluation 02/05/23    Authorization Type medicare    Progress Note Due on Visit 10    PT Start Time [phone]    PT Stop Time 1010    PT Time Calculation (min) 39 min    Activity Tolerance Patient tolerated treatment well    Behavior During Therapy WFL for tasks assessed/performed              Past Medical History:  Diagnosis Date   Allergy    seasonal allergies   RA (rheumatoid arthritis) (HCC)    juvenile   Past Surgical History:  Procedure Laterality Date   ANKLE FUSION  1997   left   INNER EAR SURGERY  1979   SYNOVECTOMY FOOT     left   Patient Active Problem List   Diagnosis Date Noted   Plantar fasciitis 01/18/2019   Pain in limb 01/01/2012   Varicose veins of lower extremities with other complications 01/01/2012    PCP: Catha Gosselin, MD  REFERRING PROVIDER: Jarrett Soho, PA-C    REFERRING DIAG: N39.3 (ICD-10-CM) - Stress incontinence of urine  THERAPY DIAG:  Muscle weakness (generalized)  Abnormal posture  Unspecified lack of coordination  Rationale for Evaluation and Treatment: Rehabilitation  ONSET DATE: over a year  SUBJECTIVE:                                                                                                                                                                                           SUBJECTIVE STATEMENT: Urine has been going well, has been doing HEP. Did decrease a bit while on vacation but has returned to daily doing them. Does still have a few drops of urine with large sneeze. Wears a pad per day but doesn't need to change due to being soiled.   Decreased fluids attempting to no have to go the bathroom frequently   Fluid intake: Yes: water - 1-1.5 bottles per day,  "a lot of hot tea", multiple seltzer waters, coffee in am     PAIN:  Are you having pain? No   PRECAUTIONS: None  WEIGHT BEARING RESTRICTIONS: No  FALLS:  Has patient fallen in last 6 months? No  LIVING ENVIRONMENT: Lives with: lives with their family Lives in: House/apartment  OCCUPATION: preschool teacher   PLOF: Independent  PATIENT GOALS: to have less leakage  PERTINENT HISTORY:  Lt  ankle fusion, RA,  Sexual abuse: No  BOWEL MOVEMENT: Pain with bowel movement: No Type of bowel movement:Type (Bristol Stool Scale) 4, Frequency daily, and Strain Yes Fully empty rectum: Yes:   Leakage: No Pads: No Fiber supplement: No  URINATION: Pain with urination: No Fully empty bladder: Yes: sometimes strains  Stream: Strong Urgency: Yes:   Frequency: not quicker than every 2 hours, 1x night Leakage: Urge to void, Coughing, Sneezing, Laughing, and Exercise Pads: Yes: all the time, sometimes would change a pad to have a new one but not due to leakage  INTERCOURSE: Pain with intercourse:  not painful Ability to have vaginal penetration:  Yes:   Climax: not painful Marinoff Scale: 0/3  PREGNANCY: Vaginal deliveries 3 Tearing Yes: episiotomy with at least first  C-section deliveries 0 Currently pregnant No  PROLAPSE: None   OBJECTIVE:   DIAGNOSTIC FINDINGS:    COGNITION: Overall cognitive status: Within functional limits for tasks assessed     SENSATION: Light touch: Appears intact Proprioception: Appears intact  MUSCLE LENGTH: Bil hamstrings and adductors limited by 25%   POSTURE: rounded shoulders, forward head, and posterior pelvic tilt  PELVIC ALIGNMENT: WFL  LUMBARAROM/PROM:  A/PROM A/PROM  eval  Flexion Limited by 25%  Extension WFL  Right lateral flexion Limited by 25%  Left lateral flexion Limited by 25%  Right rotation Limited by 25%  Left rotation Limited by 25%   (Blank rows = not tested)  LOWER EXTREMITY ROM:  WFL  LOWER  EXTREMITY MMT:  Bil hip abduction 3/5, hip flexion 3+/5, extension 3+/5, adduction 3+/5; knees 5/5 PALPATION:   General  no TTP                External Perineal Exam mild dryness noted, no TTP                             Internal Pelvic Floor no TTP  Patient confirms identification and approves PT to assess internal pelvic floor and treatment Yes  PELVIC MMT:   MMT eval  Vaginal 2/5, 2s, 4 reps - cues for technique, great difficulty with not bearing down   Internal Anal Sphincter   External Anal Sphincter   Puborectalis   Diastasis Recti   (Blank rows = not tested)        TONE: Decreased   PROLAPSE: Possible grade two anterior vaginal wall laxity in hooklying with cough  TODAY'S TREATMENT:                                                                                                                              DATE:   12/31/22  NMRE: all exercises cued for coordination breathing mechanics and pelvic floor activation  Bridges 10# 2x10 Standing alternating marching 10# with bil bicep curl Squats 2x10 20# 6# cross body crunches x10  Quick lateral two steps x10 each way   PATIENT EDUCATION:  Education  details: bladder irritants, urge drill, knack, PJTNVCKW Person educated: Patient Education method: Explanation, Demonstration, Tactile cues, and Verbal cues Education comprehension: verbalized understanding and returned demonstration  HOME EXERCISE PROGRAM: PJTNVCKW  ASSESSMENT:  CLINICAL IMPRESSION: Patient presents for treatment today, session focused on coordination of pelvic floor with hip and core strengthening. Pt benefited from cues for technique and coordination to activate pelvic floor. Demonstrated improved ability to coordination pelvic floor and breathing with exertion. Pt declined internal reassessment today, stating she has gyn appointment in a few weeks and will do pelvic exam with them also reporting she feels her symptoms are doing much better and  doesn't want to do internal today. Pt pleased with progress and feels confident with discharge today stating she understands knack with activity and stressors. Has only been having very small amount of urinary leakage with strong/forceful sneeze now. Pt understands she will need new referral for future PT needs, denies concerns at this time.   OBJECTIVE IMPAIRMENTS: decreased coordination, decreased endurance, decreased mobility, decreased strength, impaired flexibility, improper body mechanics, and postural dysfunction.   ACTIVITY LIMITATIONS: carrying, lifting, squatting, and continence  PARTICIPATION LIMITATIONS: community activity and occupation  PERSONAL FACTORS: Time since onset of injury/illness/exacerbation and 1 comorbidity: x3 vaginal births with at least one episiotomy  are also affecting patient's functional outcome.   REHAB POTENTIAL: Good  CLINICAL DECISION MAKING: Stable/uncomplicated  EVALUATION COMPLEXITY: Low   GOALS: Goals reviewed with patient? Yes  SHORT TERM GOALS: Target date: 12/03/22  Pt to be I with HEP.  Baseline: Goal status: MET  2.  Pt will have 25% less urgency due to bladder retraining and strengthening  Baseline:  Goal status: MET  3.  Pt to be I with knack with stressors to decrease urine leakage.  Baseline:  Goal status: MET  4.  Pt to be I with urge drill for improved leakage with urgency. Baseline:  Goal status: MET   LONG TERM GOALS: Target date: 02/05/23  Pt to be I with advanced HEP.  Baseline:  Goal status: MET  2.  Pt will have 50% less urgency due to bladder retraining and strengthening  Baseline:  Goal status:  MET  3.  Pt to report improved time between bladder voids to at least 2-3 hours for improved QOL with decreased urinary frequency and voiding to limit over stretching of bladder.   Baseline:  Baseline:  Goal status: MET  4.  Pt to demonstrate at least 4/5 pelvic floor strength for improved pelvic stability and  decreased strain at pelvic floor/ decrease leakage.  Baseline:  Goal status:  deferred   5.  Pt to demonstrate improved coordination of pelvic floor and breathing mechanics with 20# squat with appropriate synergistic patterns to decrease pain and leakage at least 75% of the time.    Baseline:  Goal status: MET  PLAN:  PT FREQUENCY: 1x/week  PT DURATION:  8 sessions  PLANNED INTERVENTIONS: Therapeutic exercises, Therapeutic activity, Neuromuscular re-education, Patient/Family education, Self Care, Joint mobilization, Dry Needling, Spinal mobilization, Cryotherapy, Moist heat, scar mobilization, Taping, Biofeedback, and Manual therapy  PLAN FOR NEXT SESSION: PHYSICAL THERAPY DISCHARGE SUMMARY  Visits from Start of Care: 7  Current functional level related to goals / functional outcomes: All STG met, all LTG met except internal pelvic floor strength goal as pt deferred internal reassessing today   Remaining deficits: Very few drops with strong/forceful sneeze.   Education / Equipment: HEP   Patient agrees to discharge. Patient goals were partially met. Patient is being  discharged due to being pleased with the current functional level.  Otelia Sergeant, PT, DPT 01/28/2409:31 AM

## 2023-02-05 ENCOUNTER — Encounter: Payer: Medicare Other | Admitting: Physical Therapy

## 2023-02-26 DIAGNOSIS — Z1231 Encounter for screening mammogram for malignant neoplasm of breast: Secondary | ICD-10-CM | POA: Diagnosis not present

## 2023-02-26 DIAGNOSIS — N958 Other specified menopausal and perimenopausal disorders: Secondary | ICD-10-CM | POA: Diagnosis not present

## 2023-02-26 DIAGNOSIS — Z6822 Body mass index (BMI) 22.0-22.9, adult: Secondary | ICD-10-CM | POA: Diagnosis not present

## 2023-02-26 DIAGNOSIS — M8588 Other specified disorders of bone density and structure, other site: Secondary | ICD-10-CM | POA: Diagnosis not present

## 2023-02-26 DIAGNOSIS — Z01419 Encounter for gynecological examination (general) (routine) without abnormal findings: Secondary | ICD-10-CM | POA: Diagnosis not present

## 2023-03-03 DIAGNOSIS — E559 Vitamin D deficiency, unspecified: Secondary | ICD-10-CM | POA: Diagnosis not present

## 2023-03-03 DIAGNOSIS — R946 Abnormal results of thyroid function studies: Secondary | ICD-10-CM | POA: Diagnosis not present

## 2023-03-03 DIAGNOSIS — R7989 Other specified abnormal findings of blood chemistry: Secondary | ICD-10-CM | POA: Diagnosis not present

## 2023-03-03 DIAGNOSIS — E785 Hyperlipidemia, unspecified: Secondary | ICD-10-CM | POA: Diagnosis not present

## 2023-03-17 DIAGNOSIS — Z6822 Body mass index (BMI) 22.0-22.9, adult: Secondary | ICD-10-CM | POA: Diagnosis not present

## 2023-03-17 DIAGNOSIS — Z23 Encounter for immunization: Secondary | ICD-10-CM | POA: Diagnosis not present

## 2023-03-17 DIAGNOSIS — E559 Vitamin D deficiency, unspecified: Secondary | ICD-10-CM | POA: Diagnosis not present

## 2023-03-17 DIAGNOSIS — R030 Elevated blood-pressure reading, without diagnosis of hypertension: Secondary | ICD-10-CM | POA: Diagnosis not present

## 2023-05-12 DIAGNOSIS — M25572 Pain in left ankle and joints of left foot: Secondary | ICD-10-CM | POA: Diagnosis not present

## 2023-05-12 DIAGNOSIS — Z6821 Body mass index (BMI) 21.0-21.9, adult: Secondary | ICD-10-CM | POA: Diagnosis not present

## 2023-05-12 DIAGNOSIS — Z79899 Other long term (current) drug therapy: Secondary | ICD-10-CM | POA: Diagnosis not present

## 2023-05-12 DIAGNOSIS — Z1589 Genetic susceptibility to other disease: Secondary | ICD-10-CM | POA: Diagnosis not present

## 2023-05-12 DIAGNOSIS — M0890 Juvenile arthritis, unspecified, unspecified site: Secondary | ICD-10-CM | POA: Diagnosis not present

## 2023-05-12 DIAGNOSIS — M1991 Primary osteoarthritis, unspecified site: Secondary | ICD-10-CM | POA: Diagnosis not present

## 2023-07-07 DIAGNOSIS — H9190 Unspecified hearing loss, unspecified ear: Secondary | ICD-10-CM | POA: Diagnosis not present

## 2023-07-07 DIAGNOSIS — H02403 Unspecified ptosis of bilateral eyelids: Secondary | ICD-10-CM | POA: Diagnosis not present

## 2023-07-07 DIAGNOSIS — B351 Tinea unguium: Secondary | ICD-10-CM | POA: Diagnosis not present

## 2023-07-21 DIAGNOSIS — R053 Chronic cough: Secondary | ICD-10-CM | POA: Diagnosis not present

## 2023-07-21 DIAGNOSIS — J321 Chronic frontal sinusitis: Secondary | ICD-10-CM | POA: Diagnosis not present

## 2023-09-08 DIAGNOSIS — H90A22 Sensorineural hearing loss, unilateral, left ear, with restricted hearing on the contralateral side: Secondary | ICD-10-CM | POA: Diagnosis not present

## 2023-09-17 DIAGNOSIS — B351 Tinea unguium: Secondary | ICD-10-CM | POA: Diagnosis not present

## 2023-09-17 DIAGNOSIS — Z0000 Encounter for general adult medical examination without abnormal findings: Secondary | ICD-10-CM | POA: Diagnosis not present

## 2023-09-17 DIAGNOSIS — E785 Hyperlipidemia, unspecified: Secondary | ICD-10-CM | POA: Diagnosis not present

## 2023-11-07 ENCOUNTER — Ambulatory Visit: Payer: PRIVATE HEALTH INSURANCE | Admitting: Podiatry

## 2023-11-07 ENCOUNTER — Ambulatory Visit (INDEPENDENT_AMBULATORY_CARE_PROVIDER_SITE_OTHER): Payer: PRIVATE HEALTH INSURANCE | Admitting: Podiatry

## 2023-11-07 ENCOUNTER — Ambulatory Visit (INDEPENDENT_AMBULATORY_CARE_PROVIDER_SITE_OTHER)

## 2023-11-07 ENCOUNTER — Other Ambulatory Visit: Payer: Self-pay | Admitting: Podiatry

## 2023-11-07 DIAGNOSIS — M7751 Other enthesopathy of right foot: Secondary | ICD-10-CM

## 2023-11-07 DIAGNOSIS — M25572 Pain in left ankle and joints of left foot: Secondary | ICD-10-CM

## 2023-11-07 DIAGNOSIS — M25571 Pain in right ankle and joints of right foot: Secondary | ICD-10-CM

## 2023-11-07 DIAGNOSIS — M722 Plantar fascial fibromatosis: Secondary | ICD-10-CM | POA: Diagnosis not present

## 2023-11-07 NOTE — Patient Instructions (Signed)

## 2023-11-10 DIAGNOSIS — M25572 Pain in left ankle and joints of left foot: Secondary | ICD-10-CM | POA: Diagnosis not present

## 2023-11-10 DIAGNOSIS — Z6821 Body mass index (BMI) 21.0-21.9, adult: Secondary | ICD-10-CM | POA: Diagnosis not present

## 2023-11-10 DIAGNOSIS — M1991 Primary osteoarthritis, unspecified site: Secondary | ICD-10-CM | POA: Diagnosis not present

## 2023-11-10 DIAGNOSIS — M0890 Juvenile arthritis, unspecified, unspecified site: Secondary | ICD-10-CM | POA: Diagnosis not present

## 2023-11-10 DIAGNOSIS — Z79899 Other long term (current) drug therapy: Secondary | ICD-10-CM | POA: Diagnosis not present

## 2023-11-10 NOTE — Progress Notes (Signed)
  Subjective:  Patient ID: Brittany Gutierrez, female    DOB: Dec 30, 1955,  MRN: [id number]  Chief Complaint  Patient presents with   Ankle Pain    RM#13 Difficulty with left ankle pain and swelling worsening 4-5 months.    Discussed the use of AI scribe software for clinical note transcription with the patient, who gave verbal consent to proceed.  History of Present Illness Brittany Gutierrez is a 68 year old female with arthritis and a fused ankle joint who presents with chronic left ankle pain and swelling.  She experiences chronic left ankle pain and swelling for four to five months, worsening with prolonged standing or walking, such as substituting at school or performing household chores. Pain is noted in the ligament area underneath and sometimes in the heel. Swelling and pain increase throughout the day but improve after overnight rest.  She uses a compression sock and supportive sneakers, which help reduce swelling. Diclofenac  is taken long-term for inflammation, and previous heel injections were beneficial. Currently, she does not experience severe pain.  She is concerned about maintaining her activity level and the impact of walking on her condition. [place] due to concerns about worsening her condition and is interested in learning about appropriate exercises to maintain her ankle health.      Objective:    Physical Exam General: AAO x3, NAD  Dermatological: Skin is warm, dry and supple bilateral.  There are no open sores, no preulcerative lesions, no rash or signs of infection present.  Vascular: Dorsalis Pedis artery and Posterior Tibial artery pedal pulses are 2/4 bilateral with immedate capillary fill time.  No pain with calf compression, erythema or warmth.  Neruologic: Grossly intact via light touch bilateral.   Musculoskeletal: Absent range of motion of the ankle, subtalar joint.  There is a mild edema present to the ankle mostly on the  medial aspect.  She gets tenderness to palpation on the flexor tendons.  There is also tenderness palpation along the medial band plantar fascia on the insertion in the calcaneus but also in the arch of the foot.  There is no area pinpoint tenderness.  Gait: Unassisted, Nonantalgic.     No images are attached to the encounter.    Results     Assessment:   1. Tendonitis of ankle, right   2. Plantar fasciitis      Plan:  Patient was evaluated and treated and all questions answered.  Assessment and Plan Assessment & Plan Tendinitis left ankle Chronic arthritis with joint fusion causing pain and swelling. Condition likely worsened by arthritis progression. Relief with compression socks and proper footwear. Management includes anti-inflammatory measures and exercises. - Continue compression socks during ambulation. - Provide worksheet with modified ankle exercises. - Refer to physical therapy for iontophoresis and dry needling. - Encourage shoes with good arch support on hard surfaces. - Hold corticosteroid injection unless symptoms worsen.   Return if symptoms worsen or fail to improve.   Charity Conch DPM

## 2023-12-12 DIAGNOSIS — M6281 Muscle weakness (generalized): Secondary | ICD-10-CM | POA: Diagnosis not present

## 2023-12-12 DIAGNOSIS — M25572 Pain in left ankle and joints of left foot: Secondary | ICD-10-CM | POA: Diagnosis not present

## 2023-12-12 DIAGNOSIS — M25472 Effusion, left ankle: Secondary | ICD-10-CM | POA: Diagnosis not present

## 2023-12-12 DIAGNOSIS — M25672 Stiffness of left ankle, not elsewhere classified: Secondary | ICD-10-CM | POA: Diagnosis not present

## 2023-12-15 DIAGNOSIS — M25672 Stiffness of left ankle, not elsewhere classified: Secondary | ICD-10-CM | POA: Diagnosis not present

## 2023-12-15 DIAGNOSIS — M25472 Effusion, left ankle: Secondary | ICD-10-CM | POA: Diagnosis not present

## 2023-12-15 DIAGNOSIS — M6281 Muscle weakness (generalized): Secondary | ICD-10-CM | POA: Diagnosis not present

## 2023-12-15 DIAGNOSIS — M25572 Pain in left ankle and joints of left foot: Secondary | ICD-10-CM | POA: Diagnosis not present

## 2023-12-18 DIAGNOSIS — M25572 Pain in left ankle and joints of left foot: Secondary | ICD-10-CM | POA: Diagnosis not present

## 2023-12-18 DIAGNOSIS — M25672 Stiffness of left ankle, not elsewhere classified: Secondary | ICD-10-CM | POA: Diagnosis not present

## 2023-12-18 DIAGNOSIS — M6281 Muscle weakness (generalized): Secondary | ICD-10-CM | POA: Diagnosis not present

## 2023-12-18 DIAGNOSIS — M25472 Effusion, left ankle: Secondary | ICD-10-CM | POA: Diagnosis not present

## 2023-12-22 DIAGNOSIS — M25672 Stiffness of left ankle, not elsewhere classified: Secondary | ICD-10-CM | POA: Diagnosis not present

## 2023-12-22 DIAGNOSIS — M6281 Muscle weakness (generalized): Secondary | ICD-10-CM | POA: Diagnosis not present

## 2023-12-22 DIAGNOSIS — M25572 Pain in left ankle and joints of left foot: Secondary | ICD-10-CM | POA: Diagnosis not present

## 2023-12-22 DIAGNOSIS — M25472 Effusion, left ankle: Secondary | ICD-10-CM | POA: Diagnosis not present

## 2023-12-25 DIAGNOSIS — M25672 Stiffness of left ankle, not elsewhere classified: Secondary | ICD-10-CM | POA: Diagnosis not present

## 2023-12-25 DIAGNOSIS — M25572 Pain in left ankle and joints of left foot: Secondary | ICD-10-CM | POA: Diagnosis not present

## 2023-12-25 DIAGNOSIS — M25472 Effusion, left ankle: Secondary | ICD-10-CM | POA: Diagnosis not present

## 2023-12-25 DIAGNOSIS — M6281 Muscle weakness (generalized): Secondary | ICD-10-CM | POA: Diagnosis not present

## 2023-12-29 DIAGNOSIS — M6281 Muscle weakness (generalized): Secondary | ICD-10-CM | POA: Diagnosis not present

## 2023-12-29 DIAGNOSIS — M25472 Effusion, left ankle: Secondary | ICD-10-CM | POA: Diagnosis not present

## 2023-12-29 DIAGNOSIS — M25572 Pain in left ankle and joints of left foot: Secondary | ICD-10-CM | POA: Diagnosis not present

## 2023-12-29 DIAGNOSIS — M25672 Stiffness of left ankle, not elsewhere classified: Secondary | ICD-10-CM | POA: Diagnosis not present

## 2023-12-31 DIAGNOSIS — M6281 Muscle weakness (generalized): Secondary | ICD-10-CM | POA: Diagnosis not present

## 2023-12-31 DIAGNOSIS — M25672 Stiffness of left ankle, not elsewhere classified: Secondary | ICD-10-CM | POA: Diagnosis not present

## 2023-12-31 DIAGNOSIS — M25472 Effusion, left ankle: Secondary | ICD-10-CM | POA: Diagnosis not present

## 2023-12-31 DIAGNOSIS — M25572 Pain in left ankle and joints of left foot: Secondary | ICD-10-CM | POA: Diagnosis not present

## 2024-01-05 DIAGNOSIS — M6281 Muscle weakness (generalized): Secondary | ICD-10-CM | POA: Diagnosis not present

## 2024-01-05 DIAGNOSIS — M25672 Stiffness of left ankle, not elsewhere classified: Secondary | ICD-10-CM | POA: Diagnosis not present

## 2024-01-05 DIAGNOSIS — M25572 Pain in left ankle and joints of left foot: Secondary | ICD-10-CM | POA: Diagnosis not present

## 2024-01-05 DIAGNOSIS — M25472 Effusion, left ankle: Secondary | ICD-10-CM | POA: Diagnosis not present

## 2024-01-08 DIAGNOSIS — M25672 Stiffness of left ankle, not elsewhere classified: Secondary | ICD-10-CM | POA: Diagnosis not present

## 2024-01-08 DIAGNOSIS — M6281 Muscle weakness (generalized): Secondary | ICD-10-CM | POA: Diagnosis not present

## 2024-01-08 DIAGNOSIS — M25572 Pain in left ankle and joints of left foot: Secondary | ICD-10-CM | POA: Diagnosis not present

## 2024-01-08 DIAGNOSIS — M25472 Effusion, left ankle: Secondary | ICD-10-CM | POA: Diagnosis not present

## 2024-01-13 DIAGNOSIS — M25572 Pain in left ankle and joints of left foot: Secondary | ICD-10-CM | POA: Diagnosis not present

## 2024-01-13 DIAGNOSIS — M25472 Effusion, left ankle: Secondary | ICD-10-CM | POA: Diagnosis not present

## 2024-01-13 DIAGNOSIS — M6281 Muscle weakness (generalized): Secondary | ICD-10-CM | POA: Diagnosis not present

## 2024-01-13 DIAGNOSIS — M25672 Stiffness of left ankle, not elsewhere classified: Secondary | ICD-10-CM | POA: Diagnosis not present

## 2024-01-14 DIAGNOSIS — D2262 Melanocytic nevi of left upper limb, including shoulder: Secondary | ICD-10-CM | POA: Diagnosis not present

## 2024-01-14 DIAGNOSIS — D225 Melanocytic nevi of trunk: Secondary | ICD-10-CM | POA: Diagnosis not present

## 2024-01-14 DIAGNOSIS — L821 Other seborrheic keratosis: Secondary | ICD-10-CM | POA: Diagnosis not present

## 2024-01-14 DIAGNOSIS — D1801 Hemangioma of skin and subcutaneous tissue: Secondary | ICD-10-CM | POA: Diagnosis not present

## 2024-01-14 DIAGNOSIS — L814 Other melanin hyperpigmentation: Secondary | ICD-10-CM | POA: Diagnosis not present

## 2024-01-14 DIAGNOSIS — Z85828 Personal history of other malignant neoplasm of skin: Secondary | ICD-10-CM | POA: Diagnosis not present

## 2024-01-15 DIAGNOSIS — M25572 Pain in left ankle and joints of left foot: Secondary | ICD-10-CM | POA: Diagnosis not present

## 2024-01-15 DIAGNOSIS — M25472 Effusion, left ankle: Secondary | ICD-10-CM | POA: Diagnosis not present

## 2024-01-15 DIAGNOSIS — M25672 Stiffness of left ankle, not elsewhere classified: Secondary | ICD-10-CM | POA: Diagnosis not present

## 2024-01-15 DIAGNOSIS — M6281 Muscle weakness (generalized): Secondary | ICD-10-CM | POA: Diagnosis not present

## 2024-01-19 DIAGNOSIS — M25472 Effusion, left ankle: Secondary | ICD-10-CM | POA: Diagnosis not present

## 2024-01-19 DIAGNOSIS — M25672 Stiffness of left ankle, not elsewhere classified: Secondary | ICD-10-CM | POA: Diagnosis not present

## 2024-01-19 DIAGNOSIS — M25572 Pain in left ankle and joints of left foot: Secondary | ICD-10-CM | POA: Diagnosis not present

## 2024-01-19 DIAGNOSIS — M6281 Muscle weakness (generalized): Secondary | ICD-10-CM | POA: Diagnosis not present

## 2024-01-22 DIAGNOSIS — M25572 Pain in left ankle and joints of left foot: Secondary | ICD-10-CM | POA: Diagnosis not present

## 2024-01-22 DIAGNOSIS — M25672 Stiffness of left ankle, not elsewhere classified: Secondary | ICD-10-CM | POA: Diagnosis not present

## 2024-01-22 DIAGNOSIS — M6281 Muscle weakness (generalized): Secondary | ICD-10-CM | POA: Diagnosis not present

## 2024-01-22 DIAGNOSIS — M25472 Effusion, left ankle: Secondary | ICD-10-CM | POA: Diagnosis not present

## 2024-03-01 ENCOUNTER — Other Ambulatory Visit: Payer: Self-pay

## 2024-03-01 ENCOUNTER — Encounter (HOSPITAL_BASED_OUTPATIENT_CLINIC_OR_DEPARTMENT_OTHER): Payer: Self-pay | Admitting: Urology

## 2024-03-01 ENCOUNTER — Emergency Department (HOSPITAL_BASED_OUTPATIENT_CLINIC_OR_DEPARTMENT_OTHER)
Admission: EM | Admit: 2024-03-01 | Discharge: 2024-03-01 | Disposition: A | Discharge status: Home / Self Care | Source: Ambulatory Visit

## 2024-03-01 DIAGNOSIS — I10 Essential (primary) hypertension: Secondary | ICD-10-CM | POA: Insufficient documentation

## 2024-03-01 DIAGNOSIS — R030 Elevated blood-pressure reading, without diagnosis of hypertension: Secondary | ICD-10-CM | POA: Diagnosis not present

## 2024-03-01 LAB — CBC
HCT: 39.4 % (ref 36.0–46.0)
Hemoglobin: 13.2 g/dL (ref 12.0–15.0)
MCH: 30.3 pg (ref 26.0–34.0)
MCHC: 33.5 g/dL (ref 30.0–36.0)
MCV: 90.6 fL (ref 80.0–100.0)
Platelets: 318 K/uL (ref 150–400)
RBC: 4.35 MIL/uL (ref 3.87–5.11)
RDW: 12.3 % (ref 11.5–15.5)
WBC: 6 K/uL (ref 4.0–10.5)
nRBC: 0 % (ref 0.0–0.2)

## 2024-03-01 LAB — BASIC METABOLIC PANEL WITH GFR
Anion gap: 14 (ref 5–15)
BUN: 15 mg/dL (ref 8–23)
CO2: 24 mmol/L (ref 22–32)
Calcium: 10.2 mg/dL (ref 8.9–10.3)
Chloride: 101 mmol/L (ref 98–111)
Creatinine, Ser: 0.74 mg/dL (ref 0.44–1.00)
GFR, Estimated: >60 mL/min (ref >60)
Glucose, Bld: 129 mg/dL — ABNORMAL HIGH (ref 70–99)
Potassium: 3.8 mmol/L (ref 3.5–5.1)
Sodium: 139 mmol/L (ref 135–145)

## 2024-03-01 LAB — TROPONIN T, HIGH SENSITIVITY: Troponin T High Sensitivity: <15 ng/L (ref 0–19)

## 2024-03-01 MED ORDER — VALSARTAN 80 MG PO TABS: 80.0000 mg | ORAL_TABLET | Freq: Every day | ORAL | 0 refills | Status: DC

## 2024-03-01 MED ORDER — AMLODIPINE BESYLATE 5 MG PO TABS: 5.0000 mg | ORAL_TABLET | Freq: Every day | ORAL | 0 refills | Status: AC

## 2024-03-01 NOTE — ED Provider Notes (Signed)
  EMERGENCY DEPARTMENT AT [facility] Provider Note   CSN: [id number] Arrival date & time: 03/01/24  1552     Patient presents with: Hypertension   Brittany Gutierrez is a 68 y.o. female.   This is a 68 year old female presenting emergency department for evaluation of her elevated blood pressure.  She has been having UTI symptoms was diagnosed with a UTI, but was noted to have blood pressure in the 190s systolic.  Reports no history of hypertension and not taking medications.  She states besides her dysuria she feels her normal state of health.  No headache, vision loss, facial droop, unilateral weakness, chest pain, shortness of breath.  No palpitations.  No dyspnea on exertion.   Hypertension       Prior to Admission medications   Medication Sig Start Date End Date Taking? Authorizing Provider  Calcium Carbonate-Vit D-Min 600-200 MG-UNIT TABS Take by mouth daily.    [provider]  ciclopirox  (PENLAC ) 8 % solution Apply topically at bedtime. Apply over nail and surrounding skin. Apply daily over previous coat. After seven (7) days, may remove with alcohol and continue cycle. 01/16/18   Gershon Donnice SAUNDERS, DPM  diclofenac  (VOLTAREN ) 75 MG EC tablet diclofenac  sodium 75 mg tablet,delayed release    [provider]  diclofenac  sodium (VOLTAREN ) 1 % GEL Apply 2 g topically 4 (four) times daily. Rub into affected area of foot 2 to 4 times daily 01/16/18   Gershon Donnice SAUNDERS, DPM  diclofenac  sodium (VOLTAREN ) 1 % GEL Apply 2 g topically 4 (four) times daily. Rub into affected area of foot 2 to 4 times daily 11/26/18   Gershon Donnice SAUNDERS, DPM  ibuprofen (ADVIL) 800 MG tablet  12/14/18   [provider]  JUBLIA  10 % SOLN APPLY 1 DROP TOPICALLY DAILY. 06/18/21   Gershon Donnice SAUNDERS, DPM  Multiple Vitamin (MULTIVITAMIN) tablet Take 1 tablet by mouth daily.    [provider]  naproxen (NAPROSYN) 500 MG tablet Take 500 mg by mouth as needed.     [provider]  traMADol DANNY) 50 MG tablet  12/14/18   [provider]  vitamin C (ASCORBIC ACID) 250 MG tablet Take 250 mg by mouth daily.    [provider]    Allergies: Penicillins    Review of Systems  Updated Vital Signs BP (!) 204/92   Pulse 83   Temp 98.2 F (36.8 C)   Resp 18   Ht 5' 5 (1.651 m)   Wt 54.9 kg   SpO2 96%   BMI 20.14 kg/m   Physical Exam Vitals and nursing note reviewed.  Constitutional:      General: She is not in acute distress.    Appearance: She is not toxic-appearing.  HENT:     Head: Normocephalic and atraumatic.     Nose: Nose normal.  Eyes:     Conjunctiva/sclera: Conjunctivae normal.  Cardiovascular:     Rate and Rhythm: Regular rhythm.     Pulses: Normal pulses.  Pulmonary:     Effort: Pulmonary effort is normal.     Breath sounds: Normal breath sounds.  Abdominal:     General: Abdomen is flat. There is no distension.     Tenderness: There is no abdominal tenderness. There is no guarding or rebound.  Musculoskeletal:     Right lower leg: No edema.     Left lower leg: No edema.  Skin:    General: Skin is warm and dry.  Capillary Refill: Capillary refill takes less than 2 seconds.  Neurological:     General: No focal deficit present.     Mental Status: She is alert and oriented to person, place, and time.  Psychiatric:        Mood and Affect: Mood normal.        Behavior: Behavior normal.     (all labs ordered are listed, but only abnormal results are displayed) Labs Reviewed  BASIC METABOLIC PANEL WITH GFR - Abnormal; Notable for the following components:      Result Value   Glucose, Bld 129 (*)    All other components within normal limits  CBC  TROPONIN T, HIGH SENSITIVITY    EKG: EKG Interpretation Date/Time:  Monday March 01 2024 16:26:19 EDT Ventricular Rate:  91 PR Interval:  140 QRS Duration:  88 QT Interval:  350 QTC Calculation: 430 R Axis:   27  Text  Interpretation: Normal sinus rhythm No previous ECGs available Confirmed by Neysa Clap [phone]) on 03/01/2024 6:27:26 PM  Radiology: No results found.   Procedures   Medications Ordered in the ED - No data to display                                  Medical Decision Making This is a 68 year old female presenting emergency department for elevated blood pressure.  Complicated past medical history to include RA.  Per review of records she was 197/102 at her primary doctor.  She is however, asymptomatic and without evidence of endorgan damage.  Blood pressure has improved without intervention currently in the 180s systolic labs without leukocytosis or anemia.  Basic metabolic panel with normal kidney function.  Troponin normal.  She has clear lungs on exam, and saturating 100%.  Low suspicion for acute pulmonary edema secondary to hypertensive emergency.  She has no localizing deficits and a normal neuroexam, no indication for CT head at this time.  Discussed ACEPs current guidelines with patient regarding management of asymptomatic hypertension here in the emergency department.  Will start her on oral antihypertensive and have patient follow back up with her primary doctor.  Will start her on oral antihypertensive and have patient follow back up with her primary doctor.  Your indication for admission at this time.  Discharged in stable condition.  Amount and/or Complexity of Data Reviewed Independent Historian:     Details: Husband notes no history of whitecoat syndrome External Data Reviewed:     Details: See above Labs: ordered. Radiology:  Decision-making details documented in ED Course.  Risk Decision regarding hospitalization. Diagnosis or treatment significantly limited by social determinants of health.       Final diagnoses:  None    ED Discharge Orders     None          Neysa Clap PARAS, DO 03/01/24 1840

## 2024-03-01 NOTE — ED Triage Notes (Signed)
 Pt states was at pcp appointment for UTI and was told she had high blood pressure 197/102 Told to come to ER  Does not take BP medication  Denies any associated symptoms

## 2024-03-01 NOTE — ED Provider Triage Note (Signed)
 Emergency Medicine Provider Triage Evaluation Note  Brittany Gutierrez , a 68 y.o. female  was evaluated in triage.  Pt complains of HTN urgency, sent over by PCP after begin Dx with UTI due to elevated BP noted to be 190 systolic in clinic. Not taking any BP meds.   Endorses urinary urgency.   Denies fever, headache, blurry vision, vertigo, chest pain, palpitations, shortness of breath, abdominal pain, n/v/d, dysuria, LE swelling.   Review of Systems  Positive: N/a Negative: N/a  Physical Exam  BP (!) 204/92   Pulse 83   Temp 98.2 F (36.8 C)   Resp 18   Ht 5' 5 (1.651 m)   Wt 54.9 kg   SpO2 96%   BMI 20.14 kg/m  Gen:   Awake, no distress   Resp:  Normal effort  MSK:   Moves extremities without difficulty  Other:    Medical Decision Making  Medically screening exam initiated at 4:27 PM.  Appropriate orders placed.  Brittany Gutierrez was informed that the remainder of the evaluation will be completed by another provider, this initial triage assessment does not replace that evaluation, and the importance of remaining in the ED until their evaluation is complete.    Brittany Gutierrez, [place] 03/01/24 [id number]

## 2024-03-01 NOTE — Discharge Instructions (Signed)

## 2024-03-04 DIAGNOSIS — R011 Cardiac murmur, unspecified: Secondary | ICD-10-CM | POA: Diagnosis not present

## 2024-03-04 DIAGNOSIS — Z6822 Body mass index (BMI) 22.0-22.9, adult: Secondary | ICD-10-CM | POA: Diagnosis not present

## 2024-03-04 DIAGNOSIS — I10 Essential (primary) hypertension: Secondary | ICD-10-CM | POA: Diagnosis not present

## 2024-03-10 DIAGNOSIS — I10 Essential (primary) hypertension: Secondary | ICD-10-CM | POA: Diagnosis not present

## 2024-03-10 DIAGNOSIS — R011 Cardiac murmur, unspecified: Secondary | ICD-10-CM | POA: Diagnosis not present

## 2024-03-25 DIAGNOSIS — R011 Cardiac murmur, unspecified: Secondary | ICD-10-CM | POA: Diagnosis not present

## 2024-03-25 DIAGNOSIS — E559 Vitamin D deficiency, unspecified: Secondary | ICD-10-CM | POA: Diagnosis not present

## 2024-03-25 DIAGNOSIS — Z6821 Body mass index (BMI) 21.0-21.9, adult: Secondary | ICD-10-CM | POA: Diagnosis not present

## 2024-03-25 DIAGNOSIS — R946 Abnormal results of thyroid function studies: Secondary | ICD-10-CM | POA: Diagnosis not present

## 2024-03-25 DIAGNOSIS — E785 Hyperlipidemia, unspecified: Secondary | ICD-10-CM | POA: Diagnosis not present

## 2024-03-25 DIAGNOSIS — Z23 Encounter for immunization: Secondary | ICD-10-CM | POA: Diagnosis not present

## 2024-03-25 DIAGNOSIS — I10 Essential (primary) hypertension: Secondary | ICD-10-CM | POA: Diagnosis not present
